# Patient Record
Sex: Female | Born: 1950 | Race: White | Hispanic: No | Marital: Married | Smoking: Former smoker
Health system: Southern US, Community
[De-identification: ages and names within clinical notes are randomized; demographics above are authoritative.]

## PROBLEM LIST (undated history)

## (undated) DIAGNOSIS — F419 Anxiety disorder, unspecified: Secondary | ICD-10-CM

## (undated) DIAGNOSIS — T7840XA Allergy, unspecified, initial encounter: Secondary | ICD-10-CM

## (undated) DIAGNOSIS — T8859XA Other complications of anesthesia, initial encounter: Secondary | ICD-10-CM

## (undated) DIAGNOSIS — Z9889 Other specified postprocedural states: Secondary | ICD-10-CM

## (undated) DIAGNOSIS — M1990 Unspecified osteoarthritis, unspecified site: Secondary | ICD-10-CM

## (undated) DIAGNOSIS — R112 Nausea with vomiting, unspecified: Secondary | ICD-10-CM

## (undated) DIAGNOSIS — F329 Major depressive disorder, single episode, unspecified: Secondary | ICD-10-CM

## (undated) DIAGNOSIS — K219 Gastro-esophageal reflux disease without esophagitis: Secondary | ICD-10-CM

## (undated) DIAGNOSIS — J45909 Unspecified asthma, uncomplicated: Secondary | ICD-10-CM

## (undated) DIAGNOSIS — F32A Depression, unspecified: Secondary | ICD-10-CM

## (undated) DIAGNOSIS — E039 Hypothyroidism, unspecified: Secondary | ICD-10-CM

## (undated) DIAGNOSIS — E785 Hyperlipidemia, unspecified: Secondary | ICD-10-CM

## (undated) HISTORY — DX: Unspecified asthma, uncomplicated: J45.909

## (undated) HISTORY — PX: BREAST CYST ASPIRATION: SHX578

## (undated) HISTORY — PX: TONSILLECTOMY/ADENOIDECTOMY/TURBINATE REDUCTION: SHX6126

## (undated) HISTORY — DX: Anxiety disorder, unspecified: F41.9

## (undated) HISTORY — DX: Hyperlipidemia, unspecified: E78.5

## (undated) HISTORY — DX: Allergy, unspecified, initial encounter: T78.40XA

## (undated) HISTORY — DX: Unspecified osteoarthritis, unspecified site: M19.90

## (undated) HISTORY — DX: Hypothyroidism, unspecified: E03.9

## (undated) HISTORY — PX: ABDOMINAL HYSTERECTOMY: SHX81

## (undated) HISTORY — PX: WISDOM TOOTH EXTRACTION: SHX21

## (undated) HISTORY — PX: OVARIAN CYST SURGERY: SHX726

## (undated) HISTORY — DX: Major depressive disorder, single episode, unspecified: F32.9

## (undated) HISTORY — DX: Gastro-esophageal reflux disease without esophagitis: K21.9

## (undated) HISTORY — DX: Depression, unspecified: F32.A

---

## 1989-11-03 HISTORY — PX: VAGINAL HYSTERECTOMY: SUR661

## 1998-07-03 ENCOUNTER — Other Ambulatory Visit: Admission: RE | Admit: 1998-07-03 | Discharge: 1998-07-03 | Payer: Self-pay | Admitting: Obstetrics and Gynecology

## 2001-01-06 ENCOUNTER — Ambulatory Visit (HOSPITAL_COMMUNITY): Admission: RE | Admit: 2001-01-06 | Discharge: 2001-01-06 | Payer: Self-pay | Admitting: Internal Medicine

## 2001-01-06 ENCOUNTER — Encounter: Payer: Self-pay | Admitting: Internal Medicine

## 2001-11-22 ENCOUNTER — Other Ambulatory Visit: Admission: RE | Admit: 2001-11-22 | Discharge: 2001-11-22 | Payer: Self-pay | Admitting: Obstetrics and Gynecology

## 2001-11-25 ENCOUNTER — Encounter: Admission: RE | Admit: 2001-11-25 | Discharge: 2001-11-25 | Payer: Self-pay | Admitting: Obstetrics and Gynecology

## 2001-11-25 ENCOUNTER — Encounter: Payer: Self-pay | Admitting: Obstetrics and Gynecology

## 2001-12-02 ENCOUNTER — Encounter: Payer: Self-pay | Admitting: *Deleted

## 2001-12-02 ENCOUNTER — Encounter: Admission: RE | Admit: 2001-12-02 | Discharge: 2001-12-02 | Payer: Self-pay | Admitting: *Deleted

## 2002-01-19 ENCOUNTER — Encounter: Admission: RE | Admit: 2002-01-19 | Discharge: 2002-01-19 | Payer: Self-pay | Admitting: *Deleted

## 2002-01-19 ENCOUNTER — Encounter: Payer: Self-pay | Admitting: *Deleted

## 2002-12-19 ENCOUNTER — Other Ambulatory Visit: Admission: RE | Admit: 2002-12-19 | Discharge: 2002-12-19 | Payer: Self-pay | Admitting: Obstetrics and Gynecology

## 2002-12-20 ENCOUNTER — Encounter: Payer: Self-pay | Admitting: Obstetrics and Gynecology

## 2002-12-20 ENCOUNTER — Encounter: Admission: RE | Admit: 2002-12-20 | Discharge: 2002-12-20 | Payer: Self-pay | Admitting: Obstetrics and Gynecology

## 2003-12-21 ENCOUNTER — Other Ambulatory Visit: Admission: RE | Admit: 2003-12-21 | Discharge: 2003-12-21 | Payer: Self-pay | Admitting: Obstetrics & Gynecology

## 2003-12-26 ENCOUNTER — Encounter: Admission: RE | Admit: 2003-12-26 | Discharge: 2003-12-26 | Payer: Self-pay | Admitting: Obstetrics and Gynecology

## 2005-01-07 ENCOUNTER — Encounter: Admission: RE | Admit: 2005-01-07 | Discharge: 2005-01-07 | Payer: Self-pay | Admitting: Obstetrics and Gynecology

## 2006-03-12 ENCOUNTER — Encounter: Admission: RE | Admit: 2006-03-12 | Discharge: 2006-03-12 | Payer: Self-pay | Admitting: Internal Medicine

## 2007-04-13 ENCOUNTER — Encounter: Admission: RE | Admit: 2007-04-13 | Discharge: 2007-04-13 | Payer: Self-pay | Admitting: Obstetrics and Gynecology

## 2008-04-28 ENCOUNTER — Encounter: Admission: RE | Admit: 2008-04-28 | Discharge: 2008-04-28 | Payer: Self-pay | Admitting: Obstetrics and Gynecology

## 2009-06-08 ENCOUNTER — Encounter: Admission: RE | Admit: 2009-06-08 | Discharge: 2009-06-08 | Payer: Self-pay | Admitting: Obstetrics and Gynecology

## 2009-11-03 HISTORY — PX: COLONOSCOPY: SHX174

## 2009-12-17 ENCOUNTER — Ambulatory Visit: Payer: Self-pay | Admitting: Internal Medicine

## 2010-04-10 ENCOUNTER — Encounter (INDEPENDENT_AMBULATORY_CARE_PROVIDER_SITE_OTHER): Payer: Self-pay | Admitting: *Deleted

## 2010-05-03 ENCOUNTER — Encounter (INDEPENDENT_AMBULATORY_CARE_PROVIDER_SITE_OTHER): Payer: Self-pay | Admitting: *Deleted

## 2010-05-08 ENCOUNTER — Ambulatory Visit: Payer: Self-pay | Admitting: Internal Medicine

## 2010-05-08 ENCOUNTER — Telehealth: Payer: Self-pay | Admitting: Internal Medicine

## 2010-05-22 ENCOUNTER — Ambulatory Visit: Payer: Self-pay | Admitting: Internal Medicine

## 2010-06-04 ENCOUNTER — Ambulatory Visit: Payer: Self-pay | Admitting: Internal Medicine

## 2010-11-21 ENCOUNTER — Encounter
Admission: RE | Admit: 2010-11-21 | Discharge: 2010-11-21 | Payer: Self-pay | Source: Home / Self Care | Attending: Obstetrics and Gynecology | Admitting: Obstetrics and Gynecology

## 2010-12-05 NOTE — Letter (Signed)
Summary: Previsit letter  [facility]  [address] [place], [place] [address]   Phone: [phone]  Fax: [phone]       04/10/2010 MRN: [id number]  Mary Murillo [address] [place], [place]  [address]  Dear Ms. [name],  Welcome to the Gastroenterology Division at [facility].    You are scheduled to see a nurse for your pre-procedure visit on 05-08-10 at 8:30am on the 3rd floor at [facility], [address]  We ask that you try to arrive at our office 15 minutes prior to your appointment time to allow for check-in.  Your nurse visit will consist of discussing your medical and surgical history, your immediate family medical history, and your medications.    Please bring a complete list of all your medications or, if you prefer, bring the medication bottles and we will list them.  We will need to be aware of both prescribed and over the counter drugs.  We will need to know exact dosage information as well.  If you are on blood thinners (Coumadin, Plavix, Aggrenox, Ticlid, etc.) please call our office today/prior to your appointment, as we need to consult with your physician about holding your medication.   Please be prepared to read and sign documents such as consent forms, a financial agreement, and acknowledgement forms.  If necessary, and with your consent, a friend or relative is welcome to sit-in on the nurse visit with you.  Please bring your insurance card so that we may make a copy of it.  If your insurance requires a referral to see a specialist, please bring your referral form from your primary care physician.  No co-pay is required for this nurse visit.     If you cannot keep your appointment, please call [phone] to cancel or reschedule prior to your appointment date.  This allows [place] the opportunity to schedule an appointment for another patient in need of care.    Thank you for choosing [facility] for your medical needs.  We appreciate the  opportunity to care for you.  Please visit [place] at our website  to learn more about our practice.                     Sincerely.                                                                                                                   The Gastroenterology Division

## 2010-12-05 NOTE — Letter (Signed)
Summary: [facility] Instructions  [facility]  [address] [place], [place] [address]   Phone: [phone]  Fax: [phone]       LACONDA BASICH    12-27-1950    MRN: [id number]       Procedure Day Dorna Bloom:  Wednesday 05/22/2010     Arrival Time:  7:30 am     Procedure Time:  8:30 am     Location of Procedure:                    _x _  [facility] (4th Floor)    PREPARATION FOR COLONOSCOPY WITH MIRALAX  Starting 5 days prior to your procedure Friday 7/15 do not eat nuts, seeds, popcorn, corn, beans, peas,  salads, or any raw vegetables.  Do not take any fiber supplements (e.g. Metamucil, Citrucel, and Benefiber). ____________________________________________________________________________________________________   THE DAY BEFORE YOUR PROCEDURE         DATE: Tuesday 7//19  1   Drink clear liquids the entire day-NO SOLID FOOD  2   Do not drink anything colored red or purple.  Avoid juices with pulp.  No orange juice.  3   Drink at least 64 oz. (8 glasses) of fluid/clear liquids during the day to prevent dehydration and help the prep work efficiently.  CLEAR LIQUIDS INCLUDE: Water Jello Ice Popsicles Tea (sugar ok, no milk/cream) Powdered fruit flavored drinks Coffee (sugar ok, no milk/cream) Gatorade Juice: apple, white grape, white cranberry  Lemonade Clear bullion, consomm, broth Carbonated beverages (any kind) Strained chicken noodle soup Hard Candy  4   Mix the entire bottle of Miralax with 64 oz. of Gatorade/Powerade in the morning and put in the refrigerator to chill.  5   At 3:00 pm take 2 Dulcolax/Bisacodyl tablets.  6   At 4:30 pm take one Reglan/Metoclopramide tablet.  7  Starting at 5:00 pm drink one 8 oz glass of the Miralax mixture every 15-20 minutes until you have finished drinking the entire 64 oz.  You should finish drinking prep around 7:30 or 8:00 pm.  8   If you are nauseated, you may take the 2nd Reglan/Metoclopramide  tablet at 6:30 pm.        9    At 8:00 pm take 2 more DULCOLAX/Bisacodyl tablets.     THE DAY OF YOUR PROCEDURE      DATE:  Wednesday 7/20  You may drink clear liquids until 6:30 am   (2 HOURS BEFORE PROCEDURE).   MEDICATION INSTRUCTIONS  Unless otherwise instructed, you should take regular prescription medications with a small sip of water as early as possible the morning of your procedure.           OTHER INSTRUCTIONS  You will need a responsible adult at least 61 years of age to accompany you and drive you home.   This person must remain in the waiting room during your procedure.  Wear loose fitting clothing that is easily removed.  Leave jewelry and other valuables at home.  However, you may wish to bring a book to read or an iPod/MP3 player to listen to music as you wait for your procedure to start.  Remove all body piercing jewelry and leave at home.  Total time from sign-in until discharge is approximately 2-3 hours.  You should go home directly after your procedure and rest.  You can resume normal activities the day after your procedure.  The day of your procedure you should not:  Drive   Make legal decisions   Operate machinery   Drink alcohol   Return to work  You will receive specific instructions about eating, activities and medications before you leave.   The above instructions have been reviewed and explained to me by   Ezra Sites RN  May 08, 2010 8:48 AM     I fully understand and can verbalize these instructions _____________________________ Date _______

## 2010-12-05 NOTE — Procedures (Signed)
Summary: Colonoscopy  Patient: Mary Murillo Note: All result statuses are Final unless otherwise noted.  Tests: (1) Colonoscopy (COL)   COL Colonoscopy           DONE      [facility]     [address]     [place], [place]  [address]           COLONOSCOPY PROCEDURE REPORT           PATIENT:  Mary Murillo, Mary Murillo  MR#:  [id number]     BIRTHDATE:  June 08, 1951, 59 yrs. old  GENDER:  female     ENDOSCOPIST:  Hedwig Morton. Juanda Chance, MD     REF. BY:  Sharlet Salina, M.D.     PROCEDURE DATE:  05/22/2010     PROCEDURE:  Colonoscopy 19147     ASA CLASS:  Class I     INDICATIONS:  Routine Risk Screening hx hemorrhoids, recent     hematochezia     last colon 12/2000     MEDICATIONS:   Versed 7 mg, Fentanyl 75 mcg           DESCRIPTION OF PROCEDURE:   After the risks benefits and     alternatives of the procedure were thoroughly explained, informed     consent was obtained.  Digital rectal exam was performed and     revealed no rectal masses.   The LB [id number] endoscope     was introduced through the anus and advanced to the cecum, which     was identified by both the appendix and ileocecal valve, without     limitations.  The quality of the prep was good, using MiraLax.     The instrument was then slowly withdrawn as the colon was fully     examined.     <<PROCEDUREIMAGES>>           FINDINGS:  Internal hemorrhoids were found (see image5 and     image6).  Mild diverticulosis was found (see image1). few sigmoid     diverticuli   Retroflexed views in the rectum revealed no     abnormalities.    The scope was then withdrawn from the patient     and the procedure completed.           COMPLICATIONS:  None     ENDOSCOPIC IMPRESSION:     1) Internal hemorrhoids     2) Mild diverticulosis     RECOMMENDATIONS:     1) high fiber diet     Anusol HC supp 1 hs, #12, 3 refills     REPEAT EXAM:  In 10 year(s) for.           ______________________________     Hedwig Morton. Juanda Chance, MD            CC:           n.     eSIGNED:   Hedwig Morton. Evelina Lore at 05/22/2010 09:46 AM           Roselee Culver, [phone]  Note: An exclamation mark (!) indicates a result that was not dispersed into the flowsheet. Document Creation Date: 05/22/2010 9:48 AM _______________________________________________________________________  (1) Order result status: Final Collection or observation date-time: 05/22/2010 09:40 Requested date-time:  Receipt date-time:  Reported date-time:  Referring Physician:   Ordering Physician: Lina Sar [phone]) Specimen Source:  Source: Launa Grill Order Number: [id number] Lab site:   Appended Document: Colonoscopy    Clinical Lists Changes  Observations: Added new observation of COLONNXTDUE: 05/2020 (05/22/2010 11:31)

## 2010-12-05 NOTE — Procedures (Signed)
Summary: COLONOSCOPY   Colonoscopy  Procedure date:  01/06/2001  Findings:      Location:  [facility].    Patient Name: Mary Murillo, Mary Murillo MRN: [id number] Procedure Procedures: Colonoscopy CPT: 40981.  Personnel: Endoscopist: Dora L. Juanda Chance, MD.  Exam Location: Exam performed in Endoscopy Suite.  Patient Consent: Procedure, Alternatives, Risks and Benefits discussed, consent obtained,  Indications  Evaluation of: Positive fecal occult blood test per digital rectal exam.  Symptoms: Constipation Patient's stools are infrequent. Hematochezia.  History  Pre-Exam Physical: Performed Jan 06, 2001. Cardio-pulmonary exam, Rectal exam, HEENT exam , Abdominal exam, Extremity exam, Neurological exam, Mental status exam WNL.  Exam Exam: Extent of exam reached: Cecum, extent intended: Cecum.  Colon retroflexion performed. Images taken. ASA Classification: I. Tolerance: excellent.  Monitoring: Pulse and BP monitoring, Oximetry used. Supplemental O2 given.  Colon Prep Used Golytely for colon prep. Prep results: excellent.  Fluoroscopy: Fluoroscopy was not used.  Sedation Meds: Demerol 80 mg. Versed 7 mg.  Findings - OTHER FINDING: thickened folds found in Sigmoid Colon.  HEMORRHOIDS: Size: Grade I. Not bleeding. ICD9: Hemorrhoids, Internal: 455.0.   Assessment Abnormal examination, see findings above.  Diagnoses: 455.0: Hemorrhoids, Internal.   Events  Unplanned Interventions: No intervention was required.  Unplanned Events: There were no complications. Plans Medication Plan: Hemorrhoidal Medications: Anusol Suppositories 1 HS, starting Jan 06, 2001   Patient Education: Patient given standard instructions for: Hemorrhoids. Yearly hemoccult testing recommended. Patient instructed to get routine colonoscopy every 10 years.  Comments: high fiber diet Disposition: After procedure patient sent to recovery.  Scheduling/Referral: Follow-Up prn.    CC: Nathanial Millman.Henderson,MD  This report was created from the original endoscopy report, which was reviewed and signed by the above listed endoscopist.

## 2010-12-05 NOTE — Miscellaneous (Signed)
Summary: anusol supp script  Clinical Lists Changes  Medications: Added new medication of ANUSOL-HC 25 MG  SUPP (HYDROCORTISONE ACETATE) one at bedtime - Signed Rx of ANUSOL-HC 25 MG  SUPP (HYDROCORTISONE ACETATE) one at bedtime;  #12 x 3;  Signed;  Entered by: Joylene John RN;  Authorized by: Hart Carwin MD;  Method used: [facility]  [phone][facility] [address] [place], [place], [place]  [address], Ph: [phone] or [phone], Fax: [phone]    Prescriptions: ANUSOL-HC 25 MG  SUPP (HYDROCORTISONE ACETATE) one at bedtime  #12 x 3   Entered by:   Joylene John RN   Authorized by:   Hart Carwin MD   Signed by:   Joylene John RN on 05/22/2010   Method used:   [facility] to        [facility]  [address] [phone]* (retail)       [address] [place], [place]  [address]       Ph: [phone] or [phone]       Fax: [phone]   RxID:   [id number]

## 2010-12-05 NOTE — Miscellaneous (Signed)
Summary: [facility]  Clinical Lists Changes  Medications: Added new medication of MIRALAX   POWD (POLYETHYLENE GLYCOL 3350) As per prep  instructions. - Signed Added new medication of DULCOLAX 5 MG  TBEC (BISACODYL) Day before procedure take 2 at 3pm and 2 at 8pm. - Signed Added new medication of REGLAN 10 MG  TABS (METOCLOPRAMIDE HCL) As per prep instructions. - Signed Rx of MIRALAX   POWD (POLYETHYLENE GLYCOL 3350) As per prep  instructions.;  #255gm x 0;  Signed;  Entered by: Ezra Sites RN;  Authorized by: Hart Carwin MD;  Method used: [facility]  [phone]*, [address] [place], [place], [place]  [address], Ph: [phone] or [phone], Fax: [phone] Rx of DULCOLAX 5 MG  TBEC (BISACODYL) Day before procedure take 2 at 3pm and 2 at 8pm.;  #4 x 0;  Signed;  Entered by: Ezra Sites RN;  Authorized by: Hart Carwin MD;  Method used: [facility]  [phone]*, [address] [place], [place], [place]  [address], Ph: [phone] or [phone], Fax: [phone] Rx of REGLAN 10 MG  TABS (METOCLOPRAMIDE HCL) As per prep instructions.;  #2 x 0;  Signed;  Entered by: Ezra Sites RN;  Authorized by: Hart Carwin MD;  Method used: [facility]  [phone]*, [address] [place], [place], [place]  [address], Ph: [phone] or [phone], Fax: [phone] Allergies: Added new allergy or adverse reaction of * TERAZOL CREAM Added new allergy or adverse reaction of * SURGICAL STEEL (STAPLES) Observations: Added new observation of NKA: F (05/08/2010 8:18)    Prescriptions: REGLAN 10 MG  TABS (METOCLOPRAMIDE HCL) As per prep instructions.  #2 x 0   Entered by:   Ezra Sites RN   Authorized by:   Hart Carwin MD   Signed by:   Ezra Sites RN on 05/08/2010   Method used:   [facility]ron[facility] to        [facility]  [address] [phone]* (retail)       [address]       [place], [place]  [address]       Ph: [phone] or [phone]       Fax: [phone]   RxID:    [id number] DULCOLAX 5 MG  TBEC (BISACODYL) Day before procedure take 2 at 3pm and 2 at 8pm.  #4 x 0   Entered by:   Ezra Sites RN   Authorized by:   Hart Carwin MD   Signed by:   Ezra Sites RN on 05/08/2010   Method used:   [facility]        [facility]  [address] [phone]* (retail)       [address] [place], [place]  [address]       Ph: [phone] or [phone]       Fax: [phone]   RxID:   [id number] MIRALAX   POWD (POLYETHYLENE GLYCOL 3350) As per prep  instructions.  #255gm x 0   Entered by:   Ezra Sites RN   Authorized by:   Hart Carwin MD   Signed by:   Ezra Sites RN on 05/08/2010   Method used:   [facility]        [facility]  [address] [phone]* (retail)       [address] [place]  [address]       Ph: [phone] or [phone]       Fax: [phone]   RxID:   [id number]

## 2010-12-05 NOTE — Progress Notes (Signed)
Summary: Triage  Phone Note Call from Patient Call back at Home Phone [phone]   Caller: Patient Call For: Dr. Juanda Chance Reason for Call: Talk to Nurse Summary of Call: hemorroids and wants to know if something can be prescribed....[facility] Initial call taken by: Karna Christmas,  May 08, 2010 8:58 AM  Follow-up for Phone Call        Pt. never seen in the office, is scheduled for a  Colon on 05-22-10, last colon 01-06-2001. C/O rectal bleeding in the past, none currently. States she always has hemorrhoids, uses prep-H OTC as needed. Wants to know if Dr.Lanna Labella will give her prescription strength hemorrhoid meds. "Just in case I need it to decrease the swelling before my colonoscopy."   DR.Ferlin Fairhurst PLEASE ADVISE  Follow-up by: Laureen Ochs LPN,  May 08, 8656 10:43 AM  Additional Follow-up for Phone Call Additional follow up Details #1::        Anusol HC supp, #12, Insert 1 at bedtime, she is a close acquaintance. Additional Follow-up by: Hart Carwin MD,  May 08, 2010 5:29 PM    Additional Follow-up for Phone Call Additional follow up Details #2::    Above MD orders reviewed with patient. Pt. instructed to call back as needed.  Follow-up by: Laureen Ochs LPN,  May 09, 8468 8:07 AM  New/Updated Medications: ANUCORT-HC 25 MG SUPP (HYDROCORTISONE ACETATE) Put 1 in rectum every night at bedtime. Prescriptions: ANUCORT-HC 25 MG SUPP (HYDROCORTISONE ACETATE) Put 1 in rectum every night at bedtime.  #12 x 1   Entered by:   Laureen Ochs LPN   Authorized by:   Hart Carwin MD   Signed by:   Laureen Ochs LPN on 62/95/2841   Method used:   [facility]        [facility]  [address] [phone][facility] ([facility]ail)       [address]       [place], [place]  [address]       Ph: [phone] or [phone]       Fax: [phone]   RxID:   [id number]

## 2011-03-03 ENCOUNTER — Other Ambulatory Visit: Payer: Self-pay | Admitting: Internal Medicine

## 2011-03-04 ENCOUNTER — Encounter (INDEPENDENT_AMBULATORY_CARE_PROVIDER_SITE_OTHER): Payer: BC Managed Care – PPO | Admitting: Internal Medicine

## 2011-03-04 DIAGNOSIS — E039 Hypothyroidism, unspecified: Secondary | ICD-10-CM

## 2011-03-04 DIAGNOSIS — Z23 Encounter for immunization: Secondary | ICD-10-CM

## 2011-03-04 DIAGNOSIS — E785 Hyperlipidemia, unspecified: Secondary | ICD-10-CM

## 2011-03-04 DIAGNOSIS — Z0000 Encounter for general adult medical examination without abnormal findings: Secondary | ICD-10-CM

## 2011-04-28 ENCOUNTER — Other Ambulatory Visit: Payer: BC Managed Care – PPO | Admitting: Internal Medicine

## 2011-04-28 DIAGNOSIS — E789 Disorder of lipoprotein metabolism, unspecified: Secondary | ICD-10-CM

## 2011-04-28 DIAGNOSIS — E039 Hypothyroidism, unspecified: Secondary | ICD-10-CM

## 2011-04-28 LAB — LIPID PANEL
LDL Cholesterol: 156 mg/dL — ABNORMAL HIGH (ref 0–99)
Triglycerides: 117 mg/dL (ref <150)
VLDL: 23 mg/dL (ref 0–40)

## 2011-04-29 ENCOUNTER — Ambulatory Visit
Admission: RE | Admit: 2011-04-29 | Discharge: 2011-04-29 | Disposition: A | Discharge status: Home / Self Care | Payer: BC Managed Care – PPO | Source: Ambulatory Visit | Attending: Internal Medicine | Admitting: Internal Medicine

## 2011-04-29 ENCOUNTER — Ambulatory Visit (INDEPENDENT_AMBULATORY_CARE_PROVIDER_SITE_OTHER): Payer: BC Managed Care – PPO | Admitting: Internal Medicine

## 2011-04-29 ENCOUNTER — Other Ambulatory Visit: Payer: Self-pay | Admitting: Internal Medicine

## 2011-04-29 DIAGNOSIS — E039 Hypothyroidism, unspecified: Secondary | ICD-10-CM

## 2011-04-29 DIAGNOSIS — E559 Vitamin D deficiency, unspecified: Secondary | ICD-10-CM

## 2011-04-29 DIAGNOSIS — R52 Pain, unspecified: Secondary | ICD-10-CM

## 2011-04-29 DIAGNOSIS — F3289 Other specified depressive episodes: Secondary | ICD-10-CM

## 2011-04-29 DIAGNOSIS — F329 Major depressive disorder, single episode, unspecified: Secondary | ICD-10-CM

## 2011-04-29 DIAGNOSIS — J309 Allergic rhinitis, unspecified: Secondary | ICD-10-CM

## 2011-04-29 DIAGNOSIS — F32A Depression, unspecified: Secondary | ICD-10-CM

## 2011-04-29 DIAGNOSIS — E785 Hyperlipidemia, unspecified: Secondary | ICD-10-CM

## 2011-04-30 ENCOUNTER — Encounter: Payer: Self-pay | Admitting: Internal Medicine

## 2011-04-30 DIAGNOSIS — F32A Depression, unspecified: Secondary | ICD-10-CM | POA: Insufficient documentation

## 2011-04-30 DIAGNOSIS — J309 Allergic rhinitis, unspecified: Secondary | ICD-10-CM | POA: Insufficient documentation

## 2011-04-30 DIAGNOSIS — F329 Major depressive disorder, single episode, unspecified: Secondary | ICD-10-CM | POA: Insufficient documentation

## 2011-04-30 DIAGNOSIS — E039 Hypothyroidism, unspecified: Secondary | ICD-10-CM | POA: Insufficient documentation

## 2011-04-30 DIAGNOSIS — E559 Vitamin D deficiency, unspecified: Secondary | ICD-10-CM | POA: Insufficient documentation

## 2011-04-30 DIAGNOSIS — E785 Hyperlipidemia, unspecified: Secondary | ICD-10-CM | POA: Insufficient documentation
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
## 2011-04-30 NOTE — Patient Instructions (Signed)
Continue thyroid medication as prescribed. Begin Crestor 5 mg daily. We will reassess lipid panel in 3 months

## 2011-05-01 ENCOUNTER — Encounter: Payer: Self-pay | Admitting: Internal Medicine

## 2011-05-19 ENCOUNTER — Telehealth: Payer: Self-pay | Admitting: Internal Medicine

## 2011-05-19 NOTE — Telephone Encounter (Signed)
The medication has been approved for coverage.  Pt notified.

## 2011-07-30 ENCOUNTER — Encounter: Payer: Self-pay | Admitting: Internal Medicine

## 2011-07-31 ENCOUNTER — Other Ambulatory Visit: Payer: BC Managed Care – PPO | Admitting: Internal Medicine

## 2011-08-08 ENCOUNTER — Other Ambulatory Visit: Payer: BC Managed Care – PPO | Admitting: Internal Medicine

## 2011-08-08 DIAGNOSIS — Z79899 Other long term (current) drug therapy: Secondary | ICD-10-CM

## 2011-08-08 DIAGNOSIS — E785 Hyperlipidemia, unspecified: Secondary | ICD-10-CM

## 2011-08-08 LAB — HEPATIC FUNCTION PANEL
Alkaline Phosphatase: 88 U/L (ref 39–117)
Bilirubin, Direct: 0.1 mg/dL (ref 0.0–0.3)
Indirect Bilirubin: 0.7 mg/dL (ref 0.0–0.9)
Total Protein: 7.1 g/dL (ref 6.0–8.3)

## 2011-08-08 LAB — LIPID PANEL
LDL Cholesterol: 108 mg/dL — ABNORMAL HIGH (ref 0–99)
Triglycerides: 105 mg/dL (ref <150)

## 2011-09-08 ENCOUNTER — Encounter: Payer: Self-pay | Admitting: Internal Medicine

## 2011-12-08 ENCOUNTER — Other Ambulatory Visit: Payer: Self-pay | Admitting: Internal Medicine

## 2011-12-08 DIAGNOSIS — Z1231 Encounter for screening mammogram for malignant neoplasm of breast: Secondary | ICD-10-CM

## 2011-12-30 ENCOUNTER — Ambulatory Visit
Admission: RE | Admit: 2011-12-30 | Discharge: 2011-12-30 | Disposition: A | Discharge status: Home / Self Care | Payer: BC Managed Care – PPO | Source: Ambulatory Visit | Attending: Internal Medicine | Admitting: Internal Medicine

## 2011-12-30 DIAGNOSIS — Z1231 Encounter for screening mammogram for malignant neoplasm of breast: Secondary | ICD-10-CM

## 2012-02-16 ENCOUNTER — Other Ambulatory Visit: Payer: Self-pay | Admitting: Internal Medicine

## 2012-02-20 ENCOUNTER — Other Ambulatory Visit: Payer: Self-pay | Admitting: Internal Medicine

## 2012-03-10 ENCOUNTER — Other Ambulatory Visit: Payer: Self-pay | Admitting: Internal Medicine

## 2012-03-15 ENCOUNTER — Other Ambulatory Visit: Payer: BC Managed Care – PPO | Admitting: Internal Medicine

## 2012-03-16 ENCOUNTER — Encounter: Payer: BC Managed Care – PPO | Admitting: Internal Medicine

## 2012-03-22 ENCOUNTER — Other Ambulatory Visit: Payer: Self-pay | Admitting: Internal Medicine

## 2012-04-12 ENCOUNTER — Other Ambulatory Visit: Payer: Self-pay | Admitting: Internal Medicine

## 2012-05-03 ENCOUNTER — Other Ambulatory Visit: Payer: Self-pay | Admitting: Internal Medicine

## 2012-05-03 ENCOUNTER — Other Ambulatory Visit: Payer: BC Managed Care – PPO | Admitting: Internal Medicine

## 2012-05-03 DIAGNOSIS — E039 Hypothyroidism, unspecified: Secondary | ICD-10-CM

## 2012-05-03 DIAGNOSIS — E559 Vitamin D deficiency, unspecified: Secondary | ICD-10-CM

## 2012-05-03 DIAGNOSIS — I10 Essential (primary) hypertension: Secondary | ICD-10-CM

## 2012-05-03 LAB — LIPID PANEL
Cholesterol: 141 mg/dL (ref 0–200)
HDL: 45 mg/dL (ref >39)
LDL Cholesterol: 80 mg/dL (ref 0–99)
Triglycerides: 82 mg/dL (ref <150)
VLDL: 16 mg/dL (ref 0–40)

## 2012-05-03 LAB — CBC WITH DIFFERENTIAL/PLATELET
Basophils Relative: 0 % (ref 0–1)
Eosinophils Absolute: 0.1 10*3/uL (ref 0.0–0.7)
Eosinophils Relative: 2 % (ref 0–5)
HCT: 38.9 % (ref 36.0–46.0)
Hemoglobin: 13.2 g/dL (ref 12.0–15.0)
MCH: 29.6 pg (ref 26.0–34.0)
MCHC: 33.9 g/dL (ref 30.0–36.0)
MCV: 87.2 fL (ref 78.0–100.0)
Monocytes Absolute: 0.7 10*3/uL (ref 0.1–1.0)
Monocytes Relative: 11 % (ref 3–12)
Neutrophils Relative %: 70 % (ref 43–77)

## 2012-05-03 LAB — COMPREHENSIVE METABOLIC PANEL
Alkaline Phosphatase: 87 U/L (ref 39–117)
BUN: 11 mg/dL (ref 6–23)
CO2: 25 mEq/L (ref 19–32)
Glucose, Bld: 86 mg/dL (ref 70–99)
Total Bilirubin: 0.8 mg/dL (ref 0.3–1.2)

## 2012-05-04 ENCOUNTER — Encounter: Payer: Self-pay | Admitting: Internal Medicine

## 2012-05-04 ENCOUNTER — Ambulatory Visit (INDEPENDENT_AMBULATORY_CARE_PROVIDER_SITE_OTHER): Payer: BC Managed Care – PPO | Admitting: Internal Medicine

## 2012-05-04 VITALS — BP 116/80 | HR 84 | Temp 100.0°F | Ht 62.5 in | Wt 188.0 lb

## 2012-05-04 DIAGNOSIS — W57XXXA Bitten or stung by nonvenomous insect and other nonvenomous arthropods, initial encounter: Secondary | ICD-10-CM

## 2012-05-04 DIAGNOSIS — F329 Major depressive disorder, single episode, unspecified: Secondary | ICD-10-CM

## 2012-05-04 DIAGNOSIS — E785 Hyperlipidemia, unspecified: Secondary | ICD-10-CM

## 2012-05-04 DIAGNOSIS — F3289 Other specified depressive episodes: Secondary | ICD-10-CM

## 2012-05-04 DIAGNOSIS — Z8639 Personal history of other endocrine, nutritional and metabolic disease: Secondary | ICD-10-CM

## 2012-05-04 DIAGNOSIS — E039 Hypothyroidism, unspecified: Secondary | ICD-10-CM

## 2012-05-04 DIAGNOSIS — Z8709 Personal history of other diseases of the respiratory system: Secondary | ICD-10-CM

## 2012-05-04 DIAGNOSIS — Z0000 Encounter for general adult medical examination without abnormal findings: Secondary | ICD-10-CM

## 2012-05-04 DIAGNOSIS — R509 Fever, unspecified: Secondary | ICD-10-CM

## 2012-05-04 DIAGNOSIS — J309 Allergic rhinitis, unspecified: Secondary | ICD-10-CM

## 2012-05-04 DIAGNOSIS — G4700 Insomnia, unspecified: Secondary | ICD-10-CM

## 2012-05-04 DIAGNOSIS — F32A Depression, unspecified: Secondary | ICD-10-CM

## 2012-05-04 DIAGNOSIS — J45909 Unspecified asthma, uncomplicated: Secondary | ICD-10-CM

## 2012-05-04 DIAGNOSIS — T148 Other injury of unspecified body region: Secondary | ICD-10-CM

## 2012-05-04 LAB — POCT URINALYSIS DIPSTICK
Bilirubin, UA: NEGATIVE
Ketones, UA: NEGATIVE
Leukocytes, UA: NEGATIVE
Protein, UA: NEGATIVE
Spec Grav, UA: 1.02
pH, UA: 6

## 2012-05-06 LAB — ROCKY MTN SPOTTED FVR ABS PNL(IGG+IGM): RMSF IgG: 0.07 IV

## 2012-05-29 DIAGNOSIS — Z8709 Personal history of other diseases of the respiratory system: Secondary | ICD-10-CM | POA: Insufficient documentation

## 2012-05-29 DIAGNOSIS — G4700 Insomnia, unspecified: Secondary | ICD-10-CM | POA: Insufficient documentation

## 2012-05-29 NOTE — Patient Instructions (Addendum)
Call if fever persist or symptoms do not resolve within the next few days. Otherwise return one year or as needed. Call Dr. Juanda Chance regarding repeat colonoscopy.
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
## 2012-08-11 ENCOUNTER — Emergency Department
Admission: EM | Admit: 2012-08-11 | Discharge: 2012-08-11 | Disposition: A | Discharge status: Home / Self Care | Payer: BC Managed Care – PPO | Source: Home / Self Care

## 2012-08-11 ENCOUNTER — Encounter: Payer: Self-pay | Admitting: *Deleted

## 2012-08-11 DIAGNOSIS — R300 Dysuria: Secondary | ICD-10-CM

## 2012-08-11 LAB — POCT URINALYSIS DIP (MANUAL ENTRY)
Glucose, UA: NEGATIVE
Protein Ur, POC: NEGATIVE
Spec Grav, UA: 1.01 (ref 1.005–1.03)
Urobilinogen, UA: 0.2 (ref 0–1)

## 2012-08-11 MED ORDER — CEPHALEXIN 500 MG PO CAPS: 500.0000 mg | ORAL_CAPSULE | Freq: Three times a day (TID) | ORAL | Status: AC

## 2012-08-11 MED ORDER — FLUCONAZOLE 150 MG PO TABS: 150.0000 mg | ORAL_TABLET | Freq: Once | ORAL | Status: DC

## 2012-08-11 NOTE — ED Notes (Signed)
Pt c/o urinary frequency, blood in urine and fever x last night. Denies abdominal pain and back pain.

## 2012-08-11 NOTE — ED Provider Notes (Addendum)
History     CSN: [id number]  Arrival date & time 08/11/12  1040   First MD Initiated Contact with Patient 08/11/12 1055      Chief Complaint  Patient presents with  . Urinary Frequency  . Hematuria  . Fever   HPI DYSURIA Onset:  1 day  Description: increased urinary frequency, dysuria, hematuria  Modifying factors: none   Symptoms Urgency:  yes Frequency: yes  Hesitancy:  yes Hematuria:  yes Flank Pain:  no Fever: yes Nausea/Vomiting:  no Missed LMP: no  STD exposure: no Discharge: n Irritants: no Rash: no  Red Flags   More than 3 UTI's last 12 months:  no PMH of  Diabetes or Immunosuppression:  no Renal Disease/Calculi: no Urinary Tract Abnormality:  no Instrumentation or Trauma: no    Past Medical History  Diagnosis Date  . Allergy   . Hyperlipidemia   . Vitamin D deficiency   . Hypothyroidism   . Depression     Past Surgical History  Procedure Date  . Abdominal hysterectomy   . Vaginal hysterectomy 1991    Family History  Problem Relation Age of Onset  . Cancer Sister     breast/ died    History  Substance Use Topics  . Smoking status: Former Games developer  . Smokeless tobacco: Never Used  . Alcohol Use: 1.0 oz/week    2 drink(s) per week    OB History    Grav Para Term Preterm Abortions TAB SAB Ect Mult Living                  Review of Systems  All other systems reviewed and are negative.    Allergies  Other and Terazol  Home Medications   Current Outpatient Rx  Name Route Sig Dispense Refill  . CETIRIZINE HCL 10 MG PO TABS Oral Take 10 mg by mouth daily.      Marland Kitchen VITAMIN D 2000 UNITS PO TABS Oral Take 2,000 Units by mouth daily.      . CRESTOR 5 MG PO TABS  TAKE 1 TABLET BY MOUTH DAILY 30 tablet 1  . FUROSEMIDE 20 MG PO TABS  TAKE 1 TABLET BY MOUTH AS NEEDED FOR EDEMA 30 tablet 1  . LEVOTHYROXINE SODIUM 50 MCG PO TABS  TAKE 1 TABLET BY MOUTH EVERY DAY 90 tablet 1    Dispense as written.  Marland Kitchen LEXAPRO 20 MG PO TABS  TAKE 1  TABLET BY MOUTH EVERY DAY 90 tablet 4    Dispense as written.    PT JUST GOT RX BUT IS OUT OF TOWN AND NEEDS REFILL .Marland Kitchen.  . MONTELUKAST SODIUM 10 MG PO TABS  TAKE 1 TABLET BY MOUTH EVERY DAY 90 tablet 3  . ZOLPIDEM TARTRATE 10 MG PO TABS Oral Take 10 mg by mouth at bedtime as needed.        BP 161/86  Pulse 110  Temp 98.7 F (37.1 C) (Oral)  Resp 18  Ht 5' 3.5" (1.613 m)  Wt 188 lb (85.276 kg)  BMI 32.78 kg/m2  SpO2 97%  Physical Exam  Constitutional: She appears well-developed and well-nourished.  HENT:  Head: Normocephalic and atraumatic.  Eyes: Conjunctivae normal are normal. Pupils are equal, round, and reactive to light.  Neck: Normal range of motion. Neck supple.  Cardiovascular: Normal rate and regular rhythm.   Pulmonary/Chest: Effort normal and breath sounds normal.  Abdominal: Soft. Bowel sounds are normal.       + suprapubic tenderness  No  flank pain     Musculoskeletal: Normal range of motion.  Neurological: She is alert.  Skin: Skin is warm.    ED Course  Procedures (including critical care time)   Labs Reviewed  POCT URINALYSIS DIP (MANUAL ENTRY)   No results found.   1. Dysuria       MDM  Will clinically treat for UTI  Keflex x 7 days  Diflucan for antibiotic assd vaginal candidiasis.  Urine culture  Discussed infectious and systemic red flags.  Would consider imaging if hematuria persists despite treatment.      The patient and/or caregiver has been counseled thoroughly with regard to treatment plan and/or medications prescribed including dosage, schedule, interactions, rationale for use, and possible side effects and they verbalize understanding. Diagnoses and expected course of recovery discussed and will return if not improved as expected or if the condition worsens. Patient and/or caregiver verbalized understanding.             Doree Albee, MD 08/11/12 1133  Doree Albee, MD 08/11/12 1147

## 2012-08-13 LAB — URINE CULTURE: Colony Count: 30000

## 2012-08-14 ENCOUNTER — Telehealth: Payer: Self-pay | Admitting: Family Medicine

## 2012-08-27 ENCOUNTER — Other Ambulatory Visit: Payer: Self-pay | Admitting: Internal Medicine

## 2012-09-06 ENCOUNTER — Ambulatory Visit (INDEPENDENT_AMBULATORY_CARE_PROVIDER_SITE_OTHER): Payer: BC Managed Care – PPO | Admitting: Internal Medicine

## 2012-09-06 ENCOUNTER — Encounter: Payer: Self-pay | Admitting: Internal Medicine

## 2012-09-06 VITALS — BP 128/88 | Temp 97.8°F | Wt 191.5 lb

## 2012-09-06 DIAGNOSIS — N390 Urinary tract infection, site not specified: Secondary | ICD-10-CM

## 2012-09-06 DIAGNOSIS — N898 Other specified noninflammatory disorders of vagina: Secondary | ICD-10-CM

## 2012-09-06 DIAGNOSIS — B3731 Acute candidiasis of vulva and vagina: Secondary | ICD-10-CM

## 2012-09-06 DIAGNOSIS — B373 Candidiasis of vulva and vagina: Secondary | ICD-10-CM

## 2012-09-06 LAB — POCT URINALYSIS DIPSTICK
Bilirubin, UA: NEGATIVE
Blood, UA: NEGATIVE
Ketones, UA: NEGATIVE
Protein, UA: NEGATIVE
Spec Grav, UA: 1.015
pH, UA: 6

## 2012-09-06 NOTE — Progress Notes (Signed)
  Subjective:    Patient ID: Mary Murillo, female    DOB: [birth year], 61 y.o.   MRN: [id number]  HPI For recheck on UTI. Urine specimen checked. Had hematuria with most recent UTI. Now  has vaginal itching. She is going to [place] for several weeks for study and vacation. She is a Veterinary surgeon by vocation. Patient has history of recurrent candida vaginitis infections. Says she cannot use Terazol because of adverse reaction in the past. The only thing that works for her is Diflucan. She wants to be sure she has plenty of Diflucan to take a trip with her. I'm a bit reluctant to give it to her on a daily basis. I think she should take it only if she develops Candida vaginitis.    Review of Systems     Objective:   Physical Exam White vaginal discharge. Wet prep reveals yeast.       Assessment & Plan:  Diflucan 150 mg x3 days for Candida vaginitis. For trip, will prescribe 150 mg daily for 2- 3 weeks. May try Lotrisone cream and genital area twice daily.

## 2012-09-07 LAB — POCT WET PREP (WET MOUNT)

## 2012-09-23 ENCOUNTER — Telehealth: Payer: Self-pay

## 2012-09-23 MED ORDER — FLUCONAZOLE 150 MG PO TABS: 150.0000 mg | ORAL_TABLET | Freq: Once | ORAL | Status: DC

## 2012-09-23 NOTE — Telephone Encounter (Signed)
Willing to call in 12 tabs only for trip. If this is not satisfactory needs to see GYN

## 2012-09-23 NOTE — Telephone Encounter (Signed)
Patient informed. 

## 2012-09-23 NOTE — Telephone Encounter (Signed)
On her third round of Diflucan 150mg  , total of 9 pills. Still feels she has a yeast infection. Is going out of town for 2 months and wants a rx for 30 tablets of the Diflucan to take with her.

## 2012-10-03 NOTE — Patient Instructions (Addendum)
Take Diflucan as directed

## 2012-11-27 ENCOUNTER — Other Ambulatory Visit: Payer: Self-pay | Admitting: Internal Medicine

## 2013-02-14 ENCOUNTER — Telehealth: Payer: Self-pay | Admitting: Internal Medicine

## 2013-02-14 ENCOUNTER — Ambulatory Visit: Payer: Self-pay | Admitting: Internal Medicine

## 2013-02-14 NOTE — Telephone Encounter (Signed)
Spoke with patient and advised to be here today at 4:45 to see Dr. Lenord Fellers for UTI and Diflucan Rx.

## 2013-02-15 ENCOUNTER — Encounter: Payer: Self-pay | Admitting: Internal Medicine

## 2013-02-15 ENCOUNTER — Ambulatory Visit (INDEPENDENT_AMBULATORY_CARE_PROVIDER_SITE_OTHER): Payer: BC Managed Care – PPO | Admitting: Internal Medicine

## 2013-02-15 ENCOUNTER — Other Ambulatory Visit: Payer: Self-pay | Admitting: Internal Medicine

## 2013-02-15 VITALS — BP 130/90 | Temp 99.0°F | Wt 191.0 lb

## 2013-02-15 DIAGNOSIS — N390 Urinary tract infection, site not specified: Secondary | ICD-10-CM

## 2013-02-15 DIAGNOSIS — Z1231 Encounter for screening mammogram for malignant neoplasm of breast: Secondary | ICD-10-CM

## 2013-02-22 ENCOUNTER — Ambulatory Visit (INDEPENDENT_AMBULATORY_CARE_PROVIDER_SITE_OTHER): Payer: BC Managed Care – PPO

## 2013-02-22 ENCOUNTER — Ambulatory Visit: Payer: BC Managed Care – PPO

## 2013-02-22 DIAGNOSIS — Z1231 Encounter for screening mammogram for malignant neoplasm of breast: Secondary | ICD-10-CM

## 2013-03-03 ENCOUNTER — Other Ambulatory Visit: Payer: Self-pay

## 2013-03-03 ENCOUNTER — Telehealth: Payer: Self-pay

## 2013-03-03 ENCOUNTER — Other Ambulatory Visit: Payer: Self-pay | Admitting: Internal Medicine

## 2013-03-03 MED ORDER — LEVOTHYROXINE SODIUM 50 MCG PO TABS: 50.0000 ug | ORAL_TABLET | Freq: Every day | ORAL | Status: DC

## 2013-03-03 MED ORDER — FLUCONAZOLE 150 MG PO TABS: 150.0000 mg | ORAL_TABLET | Freq: Once | ORAL | Status: DC

## 2013-03-03 NOTE — Telephone Encounter (Signed)
Has a really bad yeast infection from antibiotics following a UTI. Is itching and burning. Is requesting Diflucan, brand only, in a 100 or 300mg  tablet. Says generic doesn't work for her

## 2013-03-03 NOTE — Telephone Encounter (Signed)
Pt may have brand name Diflucan 150 mg daily x 3 days. If symptoms persist, is to see a GYN physician.

## 2013-03-03 NOTE — Telephone Encounter (Signed)
Patient informed Rx sent to pharmacy.

## 2013-03-03 NOTE — Telephone Encounter (Signed)
Due for PE Late July 2014. Please call her to book. Synthroid refilled for 90 days only.

## 2013-05-09 ENCOUNTER — Other Ambulatory Visit: Payer: BC Managed Care – PPO | Admitting: Internal Medicine

## 2013-05-09 DIAGNOSIS — E785 Hyperlipidemia, unspecified: Secondary | ICD-10-CM

## 2013-05-09 DIAGNOSIS — E039 Hypothyroidism, unspecified: Secondary | ICD-10-CM

## 2013-05-09 DIAGNOSIS — Z0000 Encounter for general adult medical examination without abnormal findings: Secondary | ICD-10-CM

## 2013-05-09 DIAGNOSIS — E559 Vitamin D deficiency, unspecified: Secondary | ICD-10-CM

## 2013-05-09 LAB — CBC WITH DIFFERENTIAL/PLATELET
Basophils Absolute: 0 K/uL (ref 0.0–0.1)
Basophils Relative: 0 % (ref 0–1)
Eosinophils Absolute: 0.1 K/uL (ref 0.0–0.7)
Eosinophils Relative: 2 % (ref 0–5)
HCT: 38.7 % (ref 36.0–46.0)
Hemoglobin: 13.1 g/dL (ref 12.0–15.0)
Lymphocytes Relative: 41 % (ref 12–46)
Lymphs Abs: 2.6 K/uL (ref 0.7–4.0)
MCH: 29.3 pg (ref 26.0–34.0)
MCHC: 33.9 g/dL (ref 30.0–36.0)
MCV: 86.6 fL (ref 78.0–100.0)
Monocytes Absolute: 0.6 K/uL (ref 0.1–1.0)
Monocytes Relative: 10 % (ref 3–12)
Neutro Abs: 3 K/uL (ref 1.7–7.7)
Neutrophils Relative %: 47 % (ref 43–77)
Platelets: 326 K/uL (ref 150–400)
RBC: 4.47 MIL/uL (ref 3.87–5.11)
RDW: 15.2 % (ref 11.5–15.5)
WBC: 6.3 K/uL (ref 4.0–10.5)

## 2013-05-09 LAB — COMPREHENSIVE METABOLIC PANEL
ALT: 20 U/L (ref 0–35)
AST: 18 U/L (ref 0–37)
Albumin: 3.9 g/dL (ref 3.5–5.2)
Calcium: 9.4 mg/dL (ref 8.4–10.5)
Chloride: 109 mEq/L (ref 96–112)
Creat: 0.87 mg/dL (ref 0.50–1.10)
Potassium: 4 mEq/L (ref 3.5–5.3)
Sodium: 142 mEq/L (ref 135–145)
Total Protein: 6.5 g/dL (ref 6.0–8.3)

## 2013-05-09 LAB — LIPID PANEL: Total CHOL/HDL Ratio: 3.5 Ratio

## 2013-05-09 LAB — TSH: TSH: 3.19 u[IU]/mL (ref 0.350–4.500)

## 2013-05-10 LAB — VITAMIN D 25 HYDROXY (VIT D DEFICIENCY, FRACTURES): Vit D, 25-Hydroxy: 67 ng/mL (ref 30–89)

## 2013-06-02 ENCOUNTER — Encounter: Payer: Self-pay | Admitting: Internal Medicine

## 2013-06-02 ENCOUNTER — Ambulatory Visit (INDEPENDENT_AMBULATORY_CARE_PROVIDER_SITE_OTHER): Payer: BC Managed Care – PPO | Admitting: Internal Medicine

## 2013-06-02 VITALS — BP 132/86 | HR 80 | Ht 63.0 in | Wt 185.0 lb

## 2013-06-02 DIAGNOSIS — Z8659 Personal history of other mental and behavioral disorders: Secondary | ICD-10-CM

## 2013-06-02 DIAGNOSIS — E785 Hyperlipidemia, unspecified: Secondary | ICD-10-CM

## 2013-06-02 DIAGNOSIS — Z8639 Personal history of other endocrine, nutritional and metabolic disease: Secondary | ICD-10-CM

## 2013-06-02 DIAGNOSIS — Z0000 Encounter for general adult medical examination without abnormal findings: Secondary | ICD-10-CM

## 2013-06-02 DIAGNOSIS — Z23 Encounter for immunization: Secondary | ICD-10-CM

## 2013-06-02 DIAGNOSIS — E039 Hypothyroidism, unspecified: Secondary | ICD-10-CM

## 2013-06-02 DIAGNOSIS — Z8709 Personal history of other diseases of the respiratory system: Secondary | ICD-10-CM

## 2013-06-02 LAB — POCT URINALYSIS DIPSTICK
Blood, UA: NEGATIVE
Glucose, UA: NEGATIVE
Nitrite, UA: NEGATIVE
Urobilinogen, UA: NEGATIVE

## 2013-06-02 MED ORDER — TETANUS-DIPHTH-ACELL PERTUSSIS 5-2.5-18.5 LF-MCG/0.5 IM SUSP: 0.5000 mL | Freq: Once | INTRAMUSCULAR | Status: DC

## 2013-06-02 MED ORDER — LEXAPRO 20 MG PO TABS: ORAL_TABLET | ORAL | Status: DC

## 2013-06-02 MED ORDER — ROSUVASTATIN CALCIUM 5 MG PO TABS: ORAL_TABLET | ORAL | Status: DC

## 2013-06-02 MED ORDER — MONTELUKAST SODIUM 10 MG PO TABS: ORAL_TABLET | ORAL | Status: DC

## 2013-06-02 MED ORDER — LEVOTHYROXINE SODIUM 50 MCG PO TABS: 50.0000 ug | ORAL_TABLET | Freq: Every day | ORAL | Status: DC

## 2013-06-02 NOTE — Patient Instructions (Addendum)
Continue same meds and return in 6 months 

## 2013-06-04 ENCOUNTER — Encounter: Payer: Self-pay | Admitting: Internal Medicine

## 2013-06-04 NOTE — Patient Instructions (Addendum)
Take Cipro 500 mg twice daily to complete 7 day course

## 2013-06-04 NOTE — Progress Notes (Signed)
  Subjective:    Patient ID: Mary Murillo, female    DOB: 1951/06/08, 62 y.o.   MRN: [id number]  HPI  62 year-old white female in  today with urinary frequency. Has started herself on some Cipro she had at home and is taken 3 doses of Cipro 500 mg. Unfortunately cannot get culture nor urine specimen today. Says she cannot void. She feels certain that this is an infection of the urinary tract. No fever or shaking chills.    Review of Systems     Objective:   Physical Exam no vaginal discharge        Assessment & Plan:  UTI-already on Cipro  Plan: Cipro 500 mg twice daily to complete 7 day course.
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
## 2013-09-08 ENCOUNTER — Other Ambulatory Visit: Payer: Self-pay | Admitting: Internal Medicine

## 2013-11-14 ENCOUNTER — Other Ambulatory Visit: Payer: Self-pay | Admitting: Internal Medicine

## 2014-02-25 ENCOUNTER — Other Ambulatory Visit: Payer: Self-pay | Admitting: Internal Medicine

## 2014-02-26 NOTE — Telephone Encounter (Signed)
Due for CPE after July 31. Please schedule now before refilling through August 2015.

## 2014-03-09 ENCOUNTER — Other Ambulatory Visit: Payer: Self-pay | Admitting: Internal Medicine

## 2014-03-09 ENCOUNTER — Ambulatory Visit (INDEPENDENT_AMBULATORY_CARE_PROVIDER_SITE_OTHER): Payer: BC Managed Care – PPO

## 2014-03-09 DIAGNOSIS — Z1231 Encounter for screening mammogram for malignant neoplasm of breast: Secondary | ICD-10-CM

## 2014-03-19 ENCOUNTER — Other Ambulatory Visit: Payer: Self-pay | Admitting: Internal Medicine

## 2014-03-21 ENCOUNTER — Other Ambulatory Visit: Payer: Self-pay | Admitting: Internal Medicine

## 2014-04-26 ENCOUNTER — Other Ambulatory Visit: Payer: Self-pay | Admitting: Internal Medicine

## 2014-06-05 ENCOUNTER — Other Ambulatory Visit: Payer: BC Managed Care – PPO | Admitting: Internal Medicine

## 2014-06-05 DIAGNOSIS — E785 Hyperlipidemia, unspecified: Secondary | ICD-10-CM

## 2014-06-05 DIAGNOSIS — E039 Hypothyroidism, unspecified: Secondary | ICD-10-CM

## 2014-06-05 DIAGNOSIS — E559 Vitamin D deficiency, unspecified: Secondary | ICD-10-CM

## 2014-06-05 DIAGNOSIS — Z79899 Other long term (current) drug therapy: Secondary | ICD-10-CM

## 2014-06-05 DIAGNOSIS — Z0000 Encounter for general adult medical examination without abnormal findings: Secondary | ICD-10-CM

## 2014-06-05 LAB — CBC WITH DIFFERENTIAL/PLATELET
Basophils Absolute: 0 10*3/uL (ref 0.0–0.1)
Basophils Relative: 0 % (ref 0–1)
EOS ABS: 0.1 10*3/uL (ref 0.0–0.7)
Eosinophils Relative: 2 % (ref 0–5)
HCT: 41.6 % (ref 36.0–46.0)
HEMOGLOBIN: 14.2 g/dL (ref 12.0–15.0)
LYMPHS ABS: 2.6 10*3/uL (ref 0.7–4.0)
LYMPHS PCT: 39 % (ref 12–46)
MCH: 29.9 pg (ref 26.0–34.0)
MCHC: 34.1 g/dL (ref 30.0–36.0)
MCV: 87.6 fL (ref 78.0–100.0)
Monocytes Absolute: 0.6 10*3/uL (ref 0.1–1.0)
Monocytes Relative: 9 % (ref 3–12)
Neutro Abs: 3.4 10*3/uL (ref 1.7–7.7)
Neutrophils Relative %: 50 % (ref 43–77)
PLATELETS: 331 10*3/uL (ref 150–400)
RBC: 4.75 MIL/uL (ref 3.87–5.11)
RDW: 14.7 % (ref 11.5–15.5)
WBC: 6.7 10*3/uL (ref 4.0–10.5)

## 2014-06-05 LAB — LIPID PANEL
CHOLESTEROL: 185 mg/dL (ref 0–200)
HDL: 54 mg/dL (ref >39)
LDL Cholesterol: 105 mg/dL — ABNORMAL HIGH (ref 0–99)
TRIGLYCERIDES: 132 mg/dL (ref <150)
Total CHOL/HDL Ratio: 3.4 Ratio
VLDL: 26 mg/dL (ref 0–40)

## 2014-06-05 LAB — COMPREHENSIVE METABOLIC PANEL
ALT: 21 U/L (ref 0–35)
AST: 17 U/L (ref 0–37)
Albumin: 4.1 g/dL (ref 3.5–5.2)
Alkaline Phosphatase: 102 U/L (ref 39–117)
BUN: 10 mg/dL (ref 6–23)
CALCIUM: 9.5 mg/dL (ref 8.4–10.5)
CHLORIDE: 106 meq/L (ref 96–112)
CO2: 26 meq/L (ref 19–32)
CREATININE: 0.68 mg/dL (ref 0.50–1.10)
GLUCOSE: 90 mg/dL (ref 70–99)
Potassium: 4.3 mEq/L (ref 3.5–5.3)
Sodium: 140 mEq/L (ref 135–145)
Total Bilirubin: 0.9 mg/dL (ref 0.2–1.2)
Total Protein: 6.8 g/dL (ref 6.0–8.3)

## 2014-06-05 LAB — TSH: TSH: 1.705 u[IU]/mL (ref 0.350–4.500)

## 2014-06-06 LAB — VITAMIN D 25 HYDROXY (VIT D DEFICIENCY, FRACTURES): Vit D, 25-Hydroxy: 70 ng/mL (ref 30–89)

## 2014-06-09 ENCOUNTER — Ambulatory Visit (INDEPENDENT_AMBULATORY_CARE_PROVIDER_SITE_OTHER): Payer: BC Managed Care – PPO | Admitting: Internal Medicine

## 2014-06-09 ENCOUNTER — Encounter: Payer: Self-pay | Admitting: Internal Medicine

## 2014-06-09 VITALS — BP 130/80 | HR 80 | Temp 98.4°F | Ht 63.0 in | Wt 188.0 lb

## 2014-06-09 DIAGNOSIS — Z87440 Personal history of urinary (tract) infections: Secondary | ICD-10-CM

## 2014-06-09 DIAGNOSIS — Z8639 Personal history of other endocrine, nutritional and metabolic disease: Secondary | ICD-10-CM

## 2014-06-09 DIAGNOSIS — Z8709 Personal history of other diseases of the respiratory system: Secondary | ICD-10-CM

## 2014-06-09 DIAGNOSIS — Z8659 Personal history of other mental and behavioral disorders: Secondary | ICD-10-CM

## 2014-06-09 DIAGNOSIS — J309 Allergic rhinitis, unspecified: Secondary | ICD-10-CM

## 2014-06-09 DIAGNOSIS — Z0000 Encounter for general adult medical examination without abnormal findings: Secondary | ICD-10-CM

## 2014-06-09 DIAGNOSIS — E039 Hypothyroidism, unspecified: Secondary | ICD-10-CM

## 2014-06-09 DIAGNOSIS — E785 Hyperlipidemia, unspecified: Secondary | ICD-10-CM

## 2014-06-09 LAB — POCT URINALYSIS DIPSTICK
BILIRUBIN UA: NEGATIVE
Blood, UA: NEGATIVE
Glucose, UA: NEGATIVE
KETONES UA: NEGATIVE
LEUKOCYTES UA: NEGATIVE
Nitrite, UA: NEGATIVE
Protein, UA: NEGATIVE
Spec Grav, UA: 1.015
Urobilinogen, UA: NEGATIVE
pH, UA: 5

## 2014-06-09 MED ORDER — ROSUVASTATIN CALCIUM 5 MG PO TABS: ORAL_TABLET | ORAL | Status: DC

## 2014-06-09 MED ORDER — ZOLPIDEM TARTRATE 10 MG PO TABS: 10.0000 mg | ORAL_TABLET | Freq: Every evening | ORAL | Status: DC | PRN

## 2014-06-09 MED ORDER — FUROSEMIDE 20 MG PO TABS: ORAL_TABLET | ORAL | Status: DC

## 2014-06-09 MED ORDER — MONTELUKAST SODIUM 10 MG PO TABS: ORAL_TABLET | ORAL | Status: DC

## 2014-06-09 MED ORDER — LEXAPRO 20 MG PO TABS: ORAL_TABLET | ORAL | Status: DC

## 2014-06-09 MED ORDER — LEVOTHYROXINE SODIUM 50 MCG PO TABS
ORAL_TABLET | ORAL | Status: DC
Start: 2014-06-09 — End: 2015-05-29

## 2014-06-09 NOTE — Patient Instructions (Signed)
Continue same meds and return in one year.
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
## 2014-07-11 ENCOUNTER — Ambulatory Visit (INDEPENDENT_AMBULATORY_CARE_PROVIDER_SITE_OTHER): Payer: BC Managed Care – PPO | Admitting: Internal Medicine

## 2014-07-11 ENCOUNTER — Encounter: Payer: Self-pay | Admitting: Internal Medicine

## 2014-07-11 VITALS — BP 148/80 | Temp 102.2°F

## 2014-07-11 DIAGNOSIS — R109 Unspecified abdominal pain: Secondary | ICD-10-CM | POA: Insufficient documentation

## 2014-07-11 DIAGNOSIS — N10 Acute tubulo-interstitial nephritis: Secondary | ICD-10-CM

## 2014-07-11 LAB — POCT URINALYSIS DIPSTICK
BILIRUBIN UA: NEGATIVE
GLUCOSE UA: NEGATIVE
Ketones, UA: NEGATIVE
NITRITE UA: NEGATIVE
Spec Grav, UA: 1.01
Urobilinogen, UA: NEGATIVE
pH, UA: 6.5

## 2014-07-11 LAB — CBC WITH DIFFERENTIAL/PLATELET
Basophils Absolute: 0 10*3/uL (ref 0.0–0.1)
Basophils Relative: 0 % (ref 0–1)
EOS PCT: 0 % (ref 0–5)
Eosinophils Absolute: 0 10*3/uL (ref 0.0–0.7)
HEMATOCRIT: 39.8 % (ref 36.0–46.0)
HEMOGLOBIN: 13.5 g/dL (ref 12.0–15.0)
LYMPHS ABS: 1.8 10*3/uL (ref 0.7–4.0)
LYMPHS PCT: 13 % (ref 12–46)
MCH: 30.5 pg (ref 26.0–34.0)
MCHC: 33.9 g/dL (ref 30.0–36.0)
MCV: 90 fL (ref 78.0–100.0)
MONOS PCT: 13 % — AB (ref 3–12)
Monocytes Absolute: 1.8 10*3/uL — ABNORMAL HIGH (ref 0.1–1.0)
Neutro Abs: 10.4 10*3/uL — ABNORMAL HIGH (ref 1.7–7.7)
Neutrophils Relative %: 74 % (ref 43–77)
PLATELETS: 370 10*3/uL (ref 150–400)
RBC: 4.42 MIL/uL (ref 3.87–5.11)
RDW: 14.6 % (ref 11.5–15.5)
WBC: 14.1 10*3/uL — AB (ref 4.0–10.5)

## 2014-07-11 MED ORDER — FLUCONAZOLE 150 MG PO TABS: 150.0000 mg | ORAL_TABLET | Freq: Once | ORAL | Status: DC

## 2014-07-11 MED ORDER — CEFTRIAXONE SODIUM 1 G IJ SOLR: 1.0000 g | Freq: Once | INTRAMUSCULAR | Status: DC

## 2014-07-11 MED ORDER — CEFTRIAXONE SODIUM 1 G IJ SOLR
1.0000 g | INTRAMUSCULAR | Status: DC
  Administered 2014-07-11: 1 g via INTRAMUSCULAR

## 2014-07-11 MED ORDER — CIPROFLOXACIN HCL 500 MG PO TABS: 500.0000 mg | ORAL_TABLET | Freq: Two times a day (BID) | ORAL | Status: DC

## 2014-07-11 NOTE — Patient Instructions (Signed)
You have been given 1 g IM Rocephin. Start Cipro 500 mg twice daily for 10 days. Return on Friday for recheck. Urine culture ordered. Call if symptoms worsen. Take Tylenol for fever. Drink plenty of fluids. Diflucan ordered should he develop Candida vaginitis.

## 2014-07-14 ENCOUNTER — Encounter: Payer: Self-pay | Admitting: Internal Medicine

## 2014-07-14 ENCOUNTER — Ambulatory Visit (INDEPENDENT_AMBULATORY_CARE_PROVIDER_SITE_OTHER): Payer: BC Managed Care – PPO | Admitting: Internal Medicine

## 2014-07-14 VITALS — BP 130/84 | HR 66 | Temp 98.8°F | Ht 63.0 in | Wt 184.0 lb

## 2014-07-14 DIAGNOSIS — N10 Acute tubulo-interstitial nephritis: Secondary | ICD-10-CM

## 2014-07-14 LAB — POCT URINALYSIS DIPSTICK
BILIRUBIN UA: NEGATIVE
GLUCOSE UA: NEGATIVE
KETONES UA: NEGATIVE
Leukocytes, UA: NEGATIVE
NITRITE UA: NEGATIVE
Protein, UA: NEGATIVE
Spec Grav, UA: 1.015
Urobilinogen, UA: NEGATIVE
pH, UA: 6

## 2014-07-14 MED ORDER — CIPROFLOXACIN HCL 500 MG PO TABS: 500.0000 mg | ORAL_TABLET | Freq: Two times a day (BID) | ORAL | Status: DC

## 2014-07-14 MED ORDER — NYSTATIN 500000 UNITS PO TABS: ORAL_TABLET | ORAL | Status: DC

## 2014-07-14 NOTE — Patient Instructions (Signed)
Finish course of antibiotics as prescribed. You have one refill to take a trip daily with you in November. Please give flu vaccine before trip to [place]. May take nystatin tablets with each dose of antibiotic.

## 2014-07-15 LAB — CULTURE, URINE COMPREHENSIVE

## 2014-07-15 NOTE — Progress Notes (Signed)
   Subjective:    Patient ID: Mary Murillo, female    DOB: Feb 17, 1951, 63 y.o.   MRN: [id number]  HPI Here today to followup on pyelonephritis. Urine culture grew coagulase-negative staph greater than 100,000 colonies per milliliter. She's beginning to feel better. No fever. Appetite is returning. Patient denies taking any antibiotics before coming to office for urinary infection. She would rather take nystatin tablets with each dose of antibiotic rather than have Diflucan. This was prescribed today. Also, patient and husband are going to [place] in November and she probably needs to take Cipro and nystatin on hand in case she gets another infection. Urinalysis today is clear.    Review of Systems     Objective:   Physical Exam  No CVA tenderness. She looks much better and has no total      Assessment & Plan:  Resolving pyelonephritis  Plan: Finish course of Cipro and take one dose of nystatin with each dose of antibiotic. New prescription for Cipro for 10 days to take a trip to [place] along with nystatin tablets. She does not want to take influenza immunization today.

## 2014-07-29 ENCOUNTER — Encounter: Payer: Self-pay | Admitting: Internal Medicine

## 2014-07-30 ENCOUNTER — Encounter: Payer: Self-pay | Admitting: Internal Medicine

## 2014-07-30 NOTE — Progress Notes (Signed)
   Subjective:    Patient ID: Mary Murillo, female    DOB: October 26, 1951, 63 y.o.   MRN: [id number]  HPI  Vague right lower quadrant pain and abdominal pain for several days. Has had some fever and chills. Also has pain in vaginal area particularly with urinating. Has multiple symptoms by history. Temperature is 102.2 today. Has also had some back pain.    Review of Systems     Objective:   Physical Exam  Slight right CVA tenderness. Vaginal exam is unremarkable. She is tender in the right lower quadrant without rebound tenderness. Urinalysis is abnormal. Culture obtained.      Assessment & Plan:  Likely has pyelonephritis  Plan: Rocephin 1 g IM. Begin Cipro 500 mg twice daily for 10 days. White blood cell count is 14,100. Stay well hydrated. Call if symptoms worsen. Return on Friday.  25 minutes spent with patient

## 2014-11-27 ENCOUNTER — Other Ambulatory Visit: Payer: Self-pay | Admitting: *Deleted

## 2014-11-27 MED ORDER — LEXAPRO 20 MG PO TABS: ORAL_TABLET | ORAL | Status: DC

## 2014-11-27 NOTE — Telephone Encounter (Signed)
Refill sent on patient Lexapro . Ok to dispense generic.

## 2015-02-05 ENCOUNTER — Other Ambulatory Visit: Payer: Self-pay | Admitting: Internal Medicine

## 2015-02-05 ENCOUNTER — Other Ambulatory Visit: Payer: Self-pay | Admitting: *Deleted

## 2015-02-05 DIAGNOSIS — Z1231 Encounter for screening mammogram for malignant neoplasm of breast: Secondary | ICD-10-CM

## 2015-02-18 ENCOUNTER — Other Ambulatory Visit: Payer: Self-pay | Admitting: Internal Medicine

## 2015-02-18 NOTE — Telephone Encounter (Signed)
Refill for 6 months. Due for CPE August

## 2015-02-19 ENCOUNTER — Other Ambulatory Visit: Payer: Self-pay | Admitting: *Deleted

## 2015-02-19 MED ORDER — MONTELUKAST SODIUM 10 MG PO TABS: ORAL_TABLET | ORAL | Status: DC

## 2015-03-14 ENCOUNTER — Ambulatory Visit: Payer: BC Managed Care – PPO

## 2015-03-17 ENCOUNTER — Other Ambulatory Visit: Payer: Self-pay | Admitting: Internal Medicine

## 2015-03-21 ENCOUNTER — Ambulatory Visit: Payer: BC Managed Care – PPO

## 2015-03-28 ENCOUNTER — Ambulatory Visit: Payer: BC Managed Care – PPO

## 2015-03-29 ENCOUNTER — Ambulatory Visit (INDEPENDENT_AMBULATORY_CARE_PROVIDER_SITE_OTHER): Payer: BC Managed Care – PPO

## 2015-03-29 DIAGNOSIS — Z1231 Encounter for screening mammogram for malignant neoplasm of breast: Secondary | ICD-10-CM

## 2015-04-04 ENCOUNTER — Ambulatory Visit: Payer: BC Managed Care – PPO

## 2015-05-01 ENCOUNTER — Ambulatory Visit (INDEPENDENT_AMBULATORY_CARE_PROVIDER_SITE_OTHER): Payer: Medicare Other | Admitting: Internal Medicine

## 2015-05-01 ENCOUNTER — Encounter: Payer: Self-pay | Admitting: Internal Medicine

## 2015-05-01 VITALS — BP 116/74 | HR 72 | Temp 97.8°F | Wt 178.5 lb

## 2015-05-01 DIAGNOSIS — R350 Frequency of micturition: Secondary | ICD-10-CM | POA: Diagnosis not present

## 2015-05-01 DIAGNOSIS — R8290 Unspecified abnormal findings in urine: Secondary | ICD-10-CM | POA: Diagnosis not present

## 2015-05-01 LAB — POCT URINALYSIS DIPSTICK
BILIRUBIN UA: NEGATIVE
Glucose, UA: NEGATIVE
Ketones, UA: NEGATIVE
NITRITE UA: NEGATIVE
Protein, UA: NEGATIVE
Urobilinogen, UA: NEGATIVE
pH, UA: 5

## 2015-05-01 MED ORDER — CIPROFLOXACIN HCL 500 MG PO TABS: 500.0000 mg | ORAL_TABLET | Freq: Two times a day (BID) | ORAL | Status: DC

## 2015-05-01 MED ORDER — NYSTATIN 500000 UNITS PO TABS: ORAL_TABLET | ORAL | Status: DC

## 2015-05-01 NOTE — Progress Notes (Signed)
   Subjective:    Patient ID: Mary Murillo, female    DOB: 1951-02-18, 64 y.o.   MRN: [id number]  HPI Patient developed onset of decreased urine output this morning. Can only produce a small amount of urine. Later on in the morning developed dysuria. No fever or shaking chills. Yesterday had bilateral back pain. Patient had pyelonephritis September 2015. She was treated with nystatin tablets and Cipro. Culture grew greater than 100,000 colonies per milliliter of coagulase-negative staph. This was thought to be a contaminant. Has not had recurrence since that time.    Review of Systems     Objective:   Physical Exam  No CVA tenderness. Dipstick urinalysis abnormal. Culture sent.      Assessment & Plan:  Acute urinary tract infection  Plan: Cipro 500 mg twice daily for 10 days. Refill nystatin tablets to take with each dose of antibiotically plus a refill. She likes to have not statin tablets on hand for Candida vaginitis.

## 2015-05-01 NOTE — Patient Instructions (Signed)
Take Cipro 500 mg twice daily for 10 days and nystatin tablets with each dose of anti-biotic. Urine culture is pending. It was a pleasure to see you today.

## 2015-05-03 LAB — URINE CULTURE: Colony Count: >=100000

## 2015-05-04 ENCOUNTER — Telehealth: Payer: Self-pay | Admitting: *Deleted

## 2015-05-04 NOTE — Telephone Encounter (Signed)
Reviewed urine culture results with patient. 

## 2015-05-16 ENCOUNTER — Other Ambulatory Visit: Payer: Self-pay | Admitting: *Deleted

## 2015-05-16 MED ORDER — LEXAPRO 20 MG PO TABS: ORAL_TABLET | ORAL | Status: DC

## 2015-05-29 ENCOUNTER — Other Ambulatory Visit: Payer: Self-pay | Admitting: Internal Medicine

## 2015-06-01 ENCOUNTER — Other Ambulatory Visit: Payer: Self-pay | Admitting: Internal Medicine

## 2015-06-15 ENCOUNTER — Other Ambulatory Visit: Payer: Medicare Other | Admitting: Internal Medicine

## 2015-06-15 DIAGNOSIS — E559 Vitamin D deficiency, unspecified: Secondary | ICD-10-CM

## 2015-06-15 DIAGNOSIS — E039 Hypothyroidism, unspecified: Secondary | ICD-10-CM

## 2015-06-15 DIAGNOSIS — Z79899 Other long term (current) drug therapy: Secondary | ICD-10-CM

## 2015-06-15 DIAGNOSIS — E785 Hyperlipidemia, unspecified: Secondary | ICD-10-CM

## 2015-06-15 DIAGNOSIS — R5383 Other fatigue: Secondary | ICD-10-CM

## 2015-06-15 LAB — COMPLETE METABOLIC PANEL WITH GFR
ALBUMIN: 3.8 g/dL (ref 3.6–5.1)
ALT: 15 U/L (ref 6–29)
AST: 17 U/L (ref 10–35)
Alkaline Phosphatase: 80 U/L (ref 33–130)
BUN: 9 mg/dL (ref 7–25)
CHLORIDE: 107 mmol/L (ref 98–110)
CO2: 25 mmol/L (ref 20–31)
Calcium: 9.6 mg/dL (ref 8.6–10.4)
Creat: 0.75 mg/dL (ref 0.50–0.99)
GFR, Est African American: >89 mL/min (ref >=60)
GFR, Est Non African American: 84 mL/min (ref >=60)
Glucose, Bld: 85 mg/dL (ref 65–99)
Potassium: 4.7 mmol/L (ref 3.5–5.3)
SODIUM: 141 mmol/L (ref 135–146)
TOTAL PROTEIN: 6.5 g/dL (ref 6.1–8.1)
Total Bilirubin: 0.9 mg/dL (ref 0.2–1.2)

## 2015-06-15 LAB — CBC WITH DIFFERENTIAL/PLATELET
BASOS ABS: 0 10*3/uL (ref 0.0–0.1)
Basophils Relative: 0 % (ref 0–1)
EOS ABS: 0.2 10*3/uL (ref 0.0–0.7)
EOS PCT: 3 % (ref 0–5)
HCT: 42.4 % (ref 36.0–46.0)
Hemoglobin: 13.8 g/dL (ref 12.0–15.0)
Lymphocytes Relative: 37 % (ref 12–46)
Lymphs Abs: 2.3 10*3/uL (ref 0.7–4.0)
MCH: 29.1 pg (ref 26.0–34.0)
MCHC: 32.5 g/dL (ref 30.0–36.0)
MCV: 89.5 fL (ref 78.0–100.0)
MPV: 9.4 fL (ref 8.6–12.4)
Monocytes Absolute: 0.6 10*3/uL (ref 0.1–1.0)
Monocytes Relative: 10 % (ref 3–12)
Neutro Abs: 3.1 10*3/uL (ref 1.7–7.7)
Neutrophils Relative %: 50 % (ref 43–77)
Platelets: 374 10*3/uL (ref 150–400)
RBC: 4.74 MIL/uL (ref 3.87–5.11)
RDW: 14.5 % (ref 11.5–15.5)
WBC: 6.2 10*3/uL (ref 4.0–10.5)

## 2015-06-15 LAB — TSH: TSH: 2.728 u[IU]/mL (ref 0.350–4.500)

## 2015-06-15 LAB — LIPID PANEL
Cholesterol: 173 mg/dL (ref 125–200)
HDL: 43 mg/dL — ABNORMAL LOW (ref >=46)
LDL CALC: 106 mg/dL (ref <130)
Total CHOL/HDL Ratio: 4 Ratio (ref <=5.0)
Triglycerides: 121 mg/dL (ref <150)
VLDL: 24 mg/dL (ref <30)

## 2015-06-16 LAB — VITAMIN D 25 HYDROXY (VIT D DEFICIENCY, FRACTURES): VIT D 25 HYDROXY: 45 ng/mL (ref 30–100)

## 2015-06-18 ENCOUNTER — Other Ambulatory Visit: Payer: Medicare Other | Admitting: Internal Medicine

## 2015-06-19 ENCOUNTER — Ambulatory Visit (INDEPENDENT_AMBULATORY_CARE_PROVIDER_SITE_OTHER): Payer: Medicare Other | Admitting: Internal Medicine

## 2015-06-19 ENCOUNTER — Encounter: Payer: Self-pay | Admitting: Internal Medicine

## 2015-06-19 VITALS — BP 118/80 | HR 76 | Temp 97.8°F | Ht 63.0 in | Wt 177.0 lb

## 2015-06-19 DIAGNOSIS — M8580 Other specified disorders of bone density and structure, unspecified site: Secondary | ICD-10-CM | POA: Diagnosis not present

## 2015-06-19 DIAGNOSIS — Z0000 Encounter for general adult medical examination without abnormal findings: Secondary | ICD-10-CM

## 2015-06-19 LAB — POCT URINALYSIS DIPSTICK
BILIRUBIN UA: NEGATIVE
Blood, UA: NEGATIVE
GLUCOSE UA: NEGATIVE
KETONES UA: NEGATIVE
Leukocytes, UA: NEGATIVE
Nitrite, UA: NEGATIVE
Protein, UA: NEGATIVE
SPEC GRAV UA: 1.01
Urobilinogen, UA: NEGATIVE
pH, UA: 6

## 2015-06-19 MED ORDER — ROSUVASTATIN CALCIUM 5 MG PO TABS: ORAL_TABLET | ORAL | Status: DC

## 2015-06-19 MED ORDER — NYSTATIN 500000 UNITS PO TABS: ORAL_TABLET | ORAL | Status: DC

## 2015-06-19 MED ORDER — LEVOTHYROXINE SODIUM 50 MCG PO TABS: ORAL_TABLET | ORAL | Status: DC

## 2015-06-19 MED ORDER — MONTELUKAST SODIUM 10 MG PO TABS: 10.0000 mg | ORAL_TABLET | Freq: Every day | ORAL | Status: DC

## 2015-06-19 MED ORDER — ZOLPIDEM TARTRATE 10 MG PO TABS: 10.0000 mg | ORAL_TABLET | Freq: Every evening | ORAL | Status: DC | PRN

## 2015-06-19 NOTE — Patient Instructions (Signed)
Bone density study. Refill meds and RTC in one year.
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
## 2015-08-09 ENCOUNTER — Encounter: Payer: Self-pay | Admitting: Internal Medicine

## 2015-08-20 ENCOUNTER — Telehealth: Payer: Self-pay

## 2015-08-20 NOTE — Telephone Encounter (Signed)
Left message for patient to call office to schedule a Prevnar vaccine.

## 2015-08-21 ENCOUNTER — Encounter: Payer: Self-pay | Admitting: Internal Medicine

## 2015-08-21 ENCOUNTER — Ambulatory Visit (INDEPENDENT_AMBULATORY_CARE_PROVIDER_SITE_OTHER): Payer: Medicare Other | Admitting: Internal Medicine

## 2015-08-21 VITALS — BP 122/82 | HR 78 | Temp 98.8°F | Ht 63.0 in | Wt 177.0 lb

## 2015-08-21 DIAGNOSIS — Z23 Encounter for immunization: Secondary | ICD-10-CM

## 2015-08-21 NOTE — Progress Notes (Signed)
Patient seen today for prevnar vaccine.  Patient tolerated well.

## 2015-09-15 ENCOUNTER — Other Ambulatory Visit: Payer: Self-pay | Admitting: Internal Medicine

## 2016-03-03 ENCOUNTER — Ambulatory Visit (INDEPENDENT_AMBULATORY_CARE_PROVIDER_SITE_OTHER): Payer: Medicare Other | Admitting: Internal Medicine

## 2016-03-03 VITALS — BP 122/80 | HR 75 | Temp 97.9°F | Resp 16 | Ht 63.0 in | Wt 168.4 lb

## 2016-03-03 DIAGNOSIS — W57XXXA Bitten or stung by nonvenomous insect and other nonvenomous arthropods, initial encounter: Secondary | ICD-10-CM | POA: Diagnosis not present

## 2016-03-03 DIAGNOSIS — R21 Rash and other nonspecific skin eruption: Secondary | ICD-10-CM | POA: Diagnosis not present

## 2016-03-03 NOTE — Progress Notes (Signed)
By signing my name below I, Tereasa Coop, attest that this documentation has been prepared under the direction and in the presence of Tami Lin, MD. Electonically Signed. Tereasa Coop, Scribe 03/03/2016 at 2:49 PM  Subjective:    Patient ID: Mary Murillo, female    DOB: 26-May-1951, 65 y.o.   MRN: [id number]  Chief Complaint  Patient presents with  . tick bite    deer tick, found on 02/21/16, started having redness x 5 days ago     HPI Mary Murillo is a 65 y.o. female who presents to the [facility] complaining of redness for the past 5 days on her anterior left thigh. Pt states that she found a small tick on her left anterior thigh on (02/21/16) where she has current redness. Pt states that area of erythema was pruritic and had clear discharge. Pt also reports that the center was firm. Pt states redness and itching are improved. Pt also reports that the tick bite area is no longer firm.  Pt states she has been checking her temperature and denies any fever or HA occuring since the tick was found.   Pt states that she has been putting charcoal over tick bite.   Pt denies any recent travels.  Pt states that she believes she found and removed the tick within a few hours of the initial bite.  It was not engorged.    Patient Active Problem List   Diagnosis Date Noted  . Insomnia 05/29/2012  . History of asthma 05/29/2012  . Hypothyroidism 04/30/2011  . Hyperlipidemia 04/30/2011  . Vitamin D deficiency 04/30/2011  . Allergic rhinitis 04/30/2011  . Depression 04/30/2011     Current outpatient prescriptions:  .  cetirizine (ZYRTEC) 10 MG tablet, Take 10 mg by mouth daily.  , Disp: , Rfl:  .  Cholecalciferol (VITAMIN D) 2000 UNITS tablet, Take 2,000 Units by mouth daily.  , Disp: , Rfl:  .  furosemide (LASIX) 20 MG tablet, TAKE 1 TABLET BY MOUTH AS NEEDED FOR EDEMA, Disp: 30 tablet, Rfl: 1 .  levothyroxine (SYNTHROID, LEVOTHROID) 50 MCG tablet, TAKE 1 TABLET  (50 MCG TOTAL) BY MOUTH DAILY BEFORE BREAKFAST., Disp: 90 tablet, Rfl: 0 .  LEXAPRO 20 MG tablet, TAKE 1 TABLET BY MOUTH EVERY DAY, Disp: 90 tablet, Rfl: 3 .  montelukast (SINGULAIR) 10 MG tablet, TAKE 1 TABLET BY MOUTH EVERY DAY, Disp: 90 tablet, Rfl: 1 .  nystatin (MYCOSTATIN) 500000 UNITS TABS tablet, Take one tablet with each dose of antibiotic, Disp: 20 tablet, Rfl: 1  Allergies  Allergen Reactions  . Other     Stainless steel- SURGICAL STAPLES  . Terazol [Terconazole]       Review of Systems  Constitutional: Negative for fever.  Skin: Positive for rash (pruritic area of erythema on left anterior thigh.).  Neurological: Negative for headaches.       Objective:   Physical Exam  Constitutional: She is oriented to person, place, and time. She appears well-developed and well-nourished. No distress.  HENT:  Head: Normocephalic and atraumatic.  Eyes: Conjunctivae are normal. Pupils are equal, round, and reactive to light.  Neck: Neck supple.  Cardiovascular: Normal rate.   Pulmonary/Chest: Effort normal.  Musculoskeletal: Normal range of motion.  Neurological: She is alert and oriented to person, place, and time.  Skin: Skin is warm and dry.  Pt has a 1.5 cm diameter area maculopapular lesion on her left anterior thigh with a central ulceration. Area is non  tender, no nodularity, no cellulitis.  No halo. No necrosis  Psychiatric: She has a normal mood and affect. Her behavior is normal.  Nursing note and vitals reviewed.    Filed Vitals:   03/03/16 1333  BP: 122/80  Pulse: 75  Temp: 97.9 F (36.6 C)  TempSrc: Oral  Resp: 16  Height: 5\' 3"  (1.6 m)  Weight: 168 lb 6.4 oz (76.386 kg)  SpO2: 98%        Assessment & Plan:  Tick bite  The best plan is for observation without treatment over the next 2 weeks with serological testing 2-4 weeks if desired or needed for confirmation  I have completed the patient encounter in its entirety as documented by the scribe,  with editing by me where necessary. Teagon Kron P. Laney Pastor, M.D.

## 2016-04-08 ENCOUNTER — Ambulatory Visit (INDEPENDENT_AMBULATORY_CARE_PROVIDER_SITE_OTHER): Payer: Medicare Other | Admitting: Internal Medicine

## 2016-04-08 ENCOUNTER — Encounter: Payer: Self-pay | Admitting: Internal Medicine

## 2016-04-08 VITALS — BP 134/84 | HR 67 | Temp 98.1°F | Resp 18 | Wt 168.0 lb

## 2016-04-08 DIAGNOSIS — W57XXXA Bitten or stung by nonvenomous insect and other nonvenomous arthropods, initial encounter: Secondary | ICD-10-CM | POA: Diagnosis not present

## 2016-04-08 DIAGNOSIS — T148 Other injury of unspecified body region: Secondary | ICD-10-CM

## 2016-04-08 NOTE — Patient Instructions (Signed)
Lyme titers drawn and pending.

## 2016-04-08 NOTE — Progress Notes (Signed)
   Subjective:    Patient ID: Mary Murillo, female    DOB: [birth year], 65 y.o.   MRN: [id number]  HPI 65 year old Female found a tick on her leg around April 20. She went to urgent medical care and saw Dr. Laney Pastor. She had done bite described as 1.5 cm area that was maculopapular with a central ulceration anterior left leg. As she had no other symptoms, he advised her to wait a Lyme titers. Now she wants Lyme titers drawn. Says she's had some arthralgias tickly in her knees. Says her daughter has chronic Lyme disease and lives in [place]. She has no headache.    Review of Systems see above     Objective:   Physical Exam  Currently has no rash. No swollen joints.      Assessment & Plan:  Tick exposure late April  Plan: Lyme titers drawn at patient request.

## 2016-04-10 LAB — LYME ABY, WSTRN BLT IGG & IGM W/BANDS
B BURGDORFERI IGG ABS (IB): NEGATIVE
B BURGDORFERI IGM ABS (IB): NEGATIVE
LYME DISEASE 23 KD IGM: NONREACTIVE
LYME DISEASE 39 KD IGG: NONREACTIVE
LYME DISEASE 41 KD IGG: NONREACTIVE
LYME DISEASE 58 KD IGG: NONREACTIVE
LYME DISEASE 66 KD IGG: NONREACTIVE
Lyme Disease 18 kD IgG: NONREACTIVE
Lyme Disease 23 kD IgG: NONREACTIVE
Lyme Disease 28 kD IgG: NONREACTIVE
Lyme Disease 30 kD IgG: NONREACTIVE
Lyme Disease 39 kD IgM: NONREACTIVE
Lyme Disease 41 kD IgM: NONREACTIVE
Lyme Disease 45 kD IgG: NONREACTIVE
Lyme Disease 93 kD IgG: NONREACTIVE

## 2016-05-29 ENCOUNTER — Telehealth: Payer: Self-pay | Admitting: Internal Medicine

## 2016-05-29 NOTE — Telephone Encounter (Signed)
Patient in [place], [place].  Forgot her Levothyroxine and Lexapro.  Patient requested that we call in [facility] @ [phone].  Called Levothyroxine 50 mcg #3, no refill. Lexapro (generic) 20mg , #3, no refill. [phone]

## 2016-06-19 ENCOUNTER — Other Ambulatory Visit: Payer: Medicare Other | Admitting: Internal Medicine

## 2016-06-19 DIAGNOSIS — F329 Major depressive disorder, single episode, unspecified: Secondary | ICD-10-CM

## 2016-06-19 DIAGNOSIS — E559 Vitamin D deficiency, unspecified: Secondary | ICD-10-CM

## 2016-06-19 DIAGNOSIS — E785 Hyperlipidemia, unspecified: Secondary | ICD-10-CM

## 2016-06-19 DIAGNOSIS — F32A Depression, unspecified: Secondary | ICD-10-CM

## 2016-06-19 DIAGNOSIS — E039 Hypothyroidism, unspecified: Secondary | ICD-10-CM

## 2016-06-19 DIAGNOSIS — Z0000 Encounter for general adult medical examination without abnormal findings: Secondary | ICD-10-CM

## 2016-06-19 LAB — CBC WITH DIFFERENTIAL/PLATELET
Basophils Absolute: 0 cells/uL (ref 0–200)
Basophils Relative: 0 %
EOS ABS: 108 {cells}/uL (ref 15–500)
Eosinophils Relative: 2 %
HEMATOCRIT: 42.9 % (ref 35.0–45.0)
Hemoglobin: 14.1 g/dL (ref 11.7–15.5)
LYMPHS PCT: 37 %
Lymphs Abs: 1998 cells/uL (ref 850–3900)
MCH: 29.9 pg (ref 27.0–33.0)
MCHC: 32.9 g/dL (ref 32.0–36.0)
MCV: 91.1 fL (ref 80.0–100.0)
MONO ABS: 594 {cells}/uL (ref 200–950)
MPV: 10 fL (ref 7.5–12.5)
Monocytes Relative: 11 %
NEUTROS PCT: 50 %
Neutro Abs: 2700 cells/uL (ref 1500–7800)
Platelets: 329 10*3/uL (ref 140–400)
RBC: 4.71 MIL/uL (ref 3.80–5.10)
RDW: 14.1 % (ref 11.0–15.0)
WBC: 5.4 10*3/uL (ref 3.8–10.8)

## 2016-06-19 LAB — COMPLETE METABOLIC PANEL WITH GFR
ALT: 20 U/L (ref 6–29)
AST: 23 U/L (ref 10–35)
Albumin: 3.8 g/dL (ref 3.6–5.1)
Alkaline Phosphatase: 87 U/L (ref 33–130)
BUN: 7 mg/dL (ref 7–25)
CALCIUM: 9.5 mg/dL (ref 8.6–10.4)
CHLORIDE: 108 mmol/L (ref 98–110)
CO2: 24 mmol/L (ref 20–31)
Creat: 0.76 mg/dL (ref 0.50–0.99)
GFR, EST NON AFRICAN AMERICAN: 82 mL/min (ref >=60)
Glucose, Bld: 87 mg/dL (ref 65–99)
POTASSIUM: 4.5 mmol/L (ref 3.5–5.3)
Sodium: 141 mmol/L (ref 135–146)
Total Bilirubin: 0.8 mg/dL (ref 0.2–1.2)
Total Protein: 6.6 g/dL (ref 6.1–8.1)

## 2016-06-19 LAB — TSH: TSH: 2.81 mIU/L

## 2016-06-19 LAB — LIPID PANEL
CHOL/HDL RATIO: 3 ratio (ref <=5.0)
CHOLESTEROL: 186 mg/dL (ref 125–200)
HDL: 61 mg/dL (ref >=46)
LDL Cholesterol: 111 mg/dL (ref <130)
TRIGLYCERIDES: 72 mg/dL (ref <150)
VLDL: 14 mg/dL (ref <30)

## 2016-06-20 ENCOUNTER — Other Ambulatory Visit: Payer: Medicare Other | Admitting: Internal Medicine

## 2016-06-20 LAB — VITAMIN D 25 HYDROXY (VIT D DEFICIENCY, FRACTURES): VIT D 25 HYDROXY: 79 ng/mL (ref 30–100)

## 2016-06-23 ENCOUNTER — Encounter: Payer: Self-pay | Admitting: Internal Medicine

## 2016-06-23 ENCOUNTER — Ambulatory Visit (INDEPENDENT_AMBULATORY_CARE_PROVIDER_SITE_OTHER): Payer: Medicare Other | Admitting: Internal Medicine

## 2016-06-23 VITALS — BP 124/86 | HR 84 | Temp 97.5°F | Ht 63.0 in | Wt 169.5 lb

## 2016-06-23 DIAGNOSIS — E039 Hypothyroidism, unspecified: Secondary | ICD-10-CM | POA: Diagnosis not present

## 2016-06-23 DIAGNOSIS — Z87440 Personal history of urinary (tract) infections: Secondary | ICD-10-CM

## 2016-06-23 DIAGNOSIS — Z8639 Personal history of other endocrine, nutritional and metabolic disease: Secondary | ICD-10-CM

## 2016-06-23 DIAGNOSIS — J309 Allergic rhinitis, unspecified: Secondary | ICD-10-CM

## 2016-06-23 DIAGNOSIS — Z8709 Personal history of other diseases of the respiratory system: Secondary | ICD-10-CM

## 2016-06-23 DIAGNOSIS — Z8659 Personal history of other mental and behavioral disorders: Secondary | ICD-10-CM | POA: Diagnosis not present

## 2016-06-23 DIAGNOSIS — Z0000 Encounter for general adult medical examination without abnormal findings: Secondary | ICD-10-CM

## 2016-06-23 LAB — POCT URINALYSIS DIPSTICK
Bilirubin, UA: NEGATIVE
Blood, UA: NEGATIVE
Glucose, UA: NEGATIVE
Ketones, UA: NEGATIVE
LEUKOCYTES UA: NEGATIVE
NITRITE UA: NEGATIVE
PH UA: 5
PROTEIN UA: NEGATIVE
Spec Grav, UA: <=1.005
UROBILINOGEN UA: 0.2

## 2016-06-23 MED ORDER — MONTELUKAST SODIUM 10 MG PO TABS: 10.0000 mg | ORAL_TABLET | Freq: Every day | ORAL | 1 refills | Status: DC

## 2016-06-23 NOTE — Progress Notes (Signed)
Subjective:    Patient ID: Mary Murillo, female    DOB: [birth year], 65 y.o.   MRN: [id number]  HPI 65 year old White female for health maintenance exam and evaluation of medical issues. Stopped wheat and dairy products. Lipids are normal off Crestor. She has a history of hypothyroidism, hyperlipidemia, depression, asthma, allergic rhinitis, recurrent urinary infections, history of vitamin D deficiency, history of Candida infections due to antibiotic therapy.  Currently feels well.  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 History of tonsillectomy and adenoidectomy in 1956. C-section 1982. Oral mass removed under her tongue in 2003. Hysterectomy without oophorectomy 1991.  Colonoscopy done by Dr. Olevia Perches July 2011 with 10 year follow-up recommended.  Zostavax vaccine May 2012. Pneumococcal 23 given in 2002. Prevnar October 2016. Tetanus immunization July 2014.  Family history: Parents in good health. One sister died at age 83 of metastatic breast cancer.  Social history: Married with 2 adult daughters. Does not smoke. Social alcohol consumption. Husband with history of colon cancer. He teaches at [employer] day school. 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   Review of Systems  Constitutional: Negative.   All other systems reviewed and are negative.      Objective:   Physical Exam  Constitutional: She is oriented to person, place, and time. She appears well-developed and well-nourished. No distress.  HENT:  Head: Normocephalic and atraumatic.  Right Ear: External ear normal.  Left Ear: External ear normal.  Mouth/Throat: Oropharynx is clear and moist.  Eyes: Conjunctivae and EOM are normal. Pupils are equal, round, and reactive to light.  Neck: Neck supple. No JVD present.  Pulmonary/Chest: Effort normal and breath sounds normal. She  has no wheezes.  Breasts normal female  Abdominal: Soft. Bowel sounds are normal. She exhibits no distension and no mass. There is no tenderness. There is no rebound and no guarding.  Lymphadenopathy:    She has no cervical adenopathy.  Neurological: She is alert and oriented to person, place, and time. She has normal reflexes. No cranial nerve deficit. Coordination normal.  Skin: Skin is warm and dry. No rash noted. She is not diaphoretic.  Psychiatric: She has a normal mood and affect. Her behavior is normal. Judgment and thought content normal.  Vitals reviewed.         Assessment & Plan:  Hypothyroidism-stable on current dose of thyroid replacement  History of hyperlipidemia-lipid panel is normal off statin medication. She does not want to resume it at the present time  History of recurrent urinary infections  History of depression-treated with Lexapro  Allergic rhinitis  History of asthma  History of vitamin D deficiency  Plan: Return in one year or as needed. Recommend annual mammogram and annual flu vaccine.  Subjective:   Patient presents for Medicare Annual/Subsequent preventive examination.  Review Past Medical/Family/Social:See above   Risk Factors  Current exercise habits: Exercises regularly Dietary issues discussed: Low fat low carbohydrate  Cardiac risk factors:Hyperlipidemia- currently normal lipid panel  Depression Screen  (Note: if answer to either of the following is "Yes", a more complete depression screening is indicated)   Over the past two weeks, have you felt down, depressed or hopeless? No  Over the past two weeks, have you felt little interest or pleasure in doing things? No Have you lost interest or pleasure in daily life? No Do  you often feel hopeless? No Do you cry easily over simple problems? No   Activities of Daily Living  In your present state of health, do you have any difficulty performing the following activities?:   Driving?  No  Managing money? No  Feeding yourself? No  Getting from bed to chair? No  Climbing a flight of stairs? No  Preparing food and eating?: No  Bathing or showering? No  Getting dressed: No  Getting to the toilet? No  Using the toilet:No  Moving around from place to place: No  In the past year have you fallen or had a near fall?:No  Are you sexually active? yes Do you have more than one partner? No   Hearing Difficulties: No  Do you often ask people to speak up or repeat themselves? Yes Do you experience ringing or noises in your ears? Yes Do you have difficulty understanding soft or whispered voices? Yes Do you feel that you have a problem with memory? No Do you often misplace items? No    Home Safety:  Do you have a smoke alarm at your residence? Yes Do you have grab bars in the bathroom?No Do you have throw rugs in your house? Yes   Cognitive Testing  Alert? Yes Normal Appearance?Yes  Oriented to person? Yes Place? Yes  Time? Yes  Recall of three objects? Yes  Can perform simple calculations? Yes  Displays appropriate judgment?Yes  Can read the correct time from a watch face?Yes   List the Names of Other Physician/Practitioners you currently use:  See referral list for the physicians patient is currently seeing.     Review of Systems: See above   Objective:     General appearance: Appears stated age  Head: Normocephalic, without obvious abnormality, atraumatic  Eyes: conj clear, EOMi PEERLA  Ears: normal TM's and external ear canals both ears  Nose: Nares normal. Septum midline. Mucosa normal. No drainage or sinus tenderness.  Throat: lips, mucosa, and tongue normal; teeth and gums normal  Neck: no adenopathy, no carotid bruit, no JVD, supple, symmetrical, trachea midline and thyroid not enlarged, symmetric, no tenderness/mass/nodules  No CVA tenderness.  Lungs: clear to auscultation bilaterally  Breasts: normal appearance, no masses or tenderness Heart:  regular rate and rhythm, S1, S2 normal, no murmur, click, rub or gallop  Abdomen: soft, non-tender; bowel sounds normal; no masses, no organomegaly  Musculoskeletal: ROM normal in all joints, no crepitus, no deformity, Normal muscle strengthen. Back  is symmetric, no curvature. Skin: Skin color, texture, turgor normal. No rashes or lesions  Lymph nodes: Cervical, supraclavicular, and axillary nodes normal.  Neurologic: CN 2 -12 Normal, Normal symmetric reflexes. Normal coordination and gait  Psych: Alert & Oriented x 3, Mood appear stable.    Assessment:    Annual wellness medicare exam   Plan:    During the course of the visit the patient was educated and counseled about appropriate screening and preventive services including:   Annual mammogram  Annual flu vaccine     Patient Instructions (the written plan) was given to the patient.  Medicare Attestation  I have personally reviewed:  The patient's medical and social history  Their use of alcohol, tobacco or illicit drugs  Their current medications and supplements  The patient's functional ability including ADLs,fall risks, home safety risks, cognitive, and hearing and visual impairment  Diet and physical activities  Evidence for depression or mood disorders  The patient's weight, height, BMI, and visual acuity have been recorded  in the chart. I have made referrals, counseling, and provided education to the patient based on review of the above and I have provided the patient with a written personalized care plan for preventive services.

## 2016-06-25 ENCOUNTER — Other Ambulatory Visit: Payer: Self-pay | Admitting: Internal Medicine

## 2016-06-27 NOTE — Patient Instructions (Signed)
It was pleasure to see you today. Stay off of Crestor. Follow-up in one year. Continue other medications as previously prescribed.

## 2016-07-28 ENCOUNTER — Ambulatory Visit (INDEPENDENT_AMBULATORY_CARE_PROVIDER_SITE_OTHER): Payer: Medicare Other | Admitting: Internal Medicine

## 2016-07-28 ENCOUNTER — Encounter: Payer: Self-pay | Admitting: Internal Medicine

## 2016-07-28 VITALS — BP 128/74 | HR 82 | Temp 99.1°F | Wt 165.0 lb

## 2016-07-28 DIAGNOSIS — R509 Fever, unspecified: Secondary | ICD-10-CM | POA: Diagnosis not present

## 2016-07-28 DIAGNOSIS — R319 Hematuria, unspecified: Secondary | ICD-10-CM

## 2016-07-28 LAB — POCT URINALYSIS DIPSTICK
Bilirubin, UA: NEGATIVE
Glucose, UA: NEGATIVE
KETONES UA: NEGATIVE
NITRITE UA: NEGATIVE
PH UA: 7
PROTEIN UA: NEGATIVE
Spec Grav, UA: <=1.005
UROBILINOGEN UA: NEGATIVE

## 2016-07-28 LAB — CBC WITH DIFFERENTIAL/PLATELET
BASOS ABS: 54 {cells}/uL (ref 0–200)
Basophils Relative: 1 %
EOS PCT: 2 %
Eosinophils Absolute: 108 cells/uL (ref 15–500)
HCT: 44.3 % (ref 35.0–45.0)
Hemoglobin: 15.2 g/dL (ref 11.7–15.5)
Lymphocytes Relative: 23 %
Lymphs Abs: 1242 cells/uL (ref 850–3900)
MCH: 30.5 pg (ref 27.0–33.0)
MCHC: 34.3 g/dL (ref 32.0–36.0)
MCV: 88.8 fL (ref 80.0–100.0)
MONOS PCT: 13 %
MPV: 10.3 fL (ref 7.5–12.5)
Monocytes Absolute: 702 cells/uL (ref 200–950)
NEUTROS ABS: 3294 {cells}/uL (ref 1500–7800)
Neutrophils Relative %: 61 %
PLATELETS: 224 10*3/uL (ref 140–400)
RBC: 4.99 MIL/uL (ref 3.80–5.10)
RDW: 13.7 % (ref 11.0–15.0)
WBC: 5.4 10*3/uL (ref 3.8–10.8)

## 2016-07-28 MED ORDER — CIPROFLOXACIN HCL 250 MG PO TABS: 250.0000 mg | ORAL_TABLET | Freq: Two times a day (BID) | ORAL | 0 refills | Status: DC

## 2016-07-28 MED ORDER — FLUCONAZOLE 150 MG PO TABS: ORAL_TABLET | ORAL | 0 refills | Status: DC

## 2016-07-28 NOTE — Patient Instructions (Addendum)
Cipro 250 mg twice daily for 10 days. Diflucan for Candida vaginitis symptoms should they develop while on Cipro. Urine culture pending. CBC with differential drawn and pending.

## 2016-07-28 NOTE — Progress Notes (Signed)
   Subjective:    Patient ID: Mary Murillo, female    DOB: 08-27-1951, 65 y.o.   MRN: [id number]  HPI patient developed fever late last week without shaking chills. No nausea or vomiting. No back pain. She has a history of similar presentation September 2015 and June 2016. In September 2015 checks [place] had pyelonephritis. Culture June 2016 grew greater than 100,000 colonies per milliliter Enterobacter cloacae. Urine culture September 2015: Grew staph coagulase negative species but she clearly had pyelonephritis.    Review of Systems     Objective:   Physical Exam No CVA tenderness. Urinalysis abnormal and culture was sent. Has moderate blood on urine dipstick and small LE.       Assessment & Plan:  Acute urinary tract infection  Plan: Cipro 250 mg twice daily for 10 days. Diflucan 150 mg tablets #3. She'll take one third of a tablet daily for Candida vaginitis. Says it 500 mg of Cipro makes her feel weird so we cut the dose daily. Urine culture pending. CBC with differential drawn.

## 2016-07-29 LAB — URINE CULTURE: Organism ID, Bacteria: NO GROWTH

## 2016-08-05 ENCOUNTER — Other Ambulatory Visit: Payer: Self-pay | Admitting: Internal Medicine

## 2016-08-05 DIAGNOSIS — Z9289 Personal history of other medical treatment: Secondary | ICD-10-CM

## 2016-08-12 ENCOUNTER — Ambulatory Visit: Payer: Medicare Other | Admitting: Internal Medicine

## 2016-08-13 ENCOUNTER — Ambulatory Visit (INDEPENDENT_AMBULATORY_CARE_PROVIDER_SITE_OTHER): Payer: Medicare Other

## 2016-08-13 DIAGNOSIS — M85851 Other specified disorders of bone density and structure, right thigh: Secondary | ICD-10-CM

## 2016-08-13 DIAGNOSIS — Z1231 Encounter for screening mammogram for malignant neoplasm of breast: Secondary | ICD-10-CM

## 2016-08-13 DIAGNOSIS — M8580 Other specified disorders of bone density and structure, unspecified site: Secondary | ICD-10-CM

## 2016-08-13 DIAGNOSIS — Z9289 Personal history of other medical treatment: Secondary | ICD-10-CM

## 2016-12-22 ENCOUNTER — Other Ambulatory Visit: Payer: Self-pay | Admitting: Internal Medicine

## 2017-01-05 ENCOUNTER — Other Ambulatory Visit: Payer: Self-pay | Admitting: Internal Medicine

## 2017-02-18 ENCOUNTER — Ambulatory Visit (INDEPENDENT_AMBULATORY_CARE_PROVIDER_SITE_OTHER): Payer: Medicare Other | Admitting: Podiatry

## 2017-02-18 VITALS — BP 150/95 | HR 70

## 2017-02-18 DIAGNOSIS — M79676 Pain in unspecified toe(s): Secondary | ICD-10-CM

## 2017-02-18 DIAGNOSIS — B351 Tinea unguium: Secondary | ICD-10-CM | POA: Diagnosis not present

## 2017-02-18 NOTE — Progress Notes (Signed)
   Subjective:    Patient ID: Mary Murillo, female    DOB: July 03, 1951, 66 y.o.   MRN: [id number]  HPI this patient presents the office stating that she has yellow discolored great toenail on the inside of the big toe. He says that it has been discolored for over a year. She says she has been previously diagnosed with fungus toenail and has been treated with Lamisil by mouth and Diflucan. She has been applying topical medicine for the last year and the outside big toenail has improved but not the inside. She presents requesting laser treatment of this discolored nail.     Review of Systems  All other systems reviewed and are negative.      Objective:   Physical Exam GENERAL APPEARANCE: Alert, conversant. Appropriately groomed. No acute distress.  VASCULAR: Pedal pulses are  palpable at  [facility] and PT bilateral.  Capillary refill time is immediate to all digits,  Normal temperature gradient.  Digital hair growth is present bilateral  NEUROLOGIC: sensation is normal to 5.07 monofilament at 5/5 sites bilateral.  Light touch is intact bilateral, Muscle strength normal.  MUSCULOSKELETAL: acceptable muscle strength, tone and stability bilateral.  DJD 1st MPJ right foot.  Dorsomedial exostosis noted.    DERMATOLOGIC: skin color, texture, and turgor are within normal limits.  No preulcerative lesions or ulcers  are seen, no interdigital maceration noted.  No open lesions present.  Digital nails are asymptomatic.  There is yellow discoloration medial border right great toenail.  No drainage noted.          Assessment & Plan:  Onychomycosis  Right hallux  IE  Debridement of right hallux.  Nail to be sent to lab to determine if fungus.  To be called once the results are returned.   Gardiner Barefoot DPM

## 2017-02-18 NOTE — Addendum Note (Signed)
Addended byDeidre Ala, Jurney Overacker L on: 02/18/2017 05:25 PM   Modules accepted: Orders

## 2017-02-19 ENCOUNTER — Telehealth: Payer: Self-pay | Admitting: Internal Medicine

## 2017-02-19 MED ORDER — ZOLPIDEM TARTRATE 10 MG PO TABS: 10.0000 mg | ORAL_TABLET | Freq: Every evening | ORAL | 5 refills | Status: DC | PRN

## 2017-02-19 NOTE — Telephone Encounter (Signed)
Patient was on Ambien 10mg  back in 2017 (generic).  Her father had a stroke a few months back and 2 days later she found out her Mother needed 24 hour care.  She is scheduled for CPE in August with you.  She wants to know if she can get a refill on this please.  She's having trouble staying asleep for longer than a couple of hours at a time at night.    [facility] [place] [phone][facility]  Best # to reach the patient at:  [phone]

## 2017-02-19 NOTE — Telephone Encounter (Signed)
Called in to pt's pharmacy

## 2017-02-19 NOTE — Telephone Encounter (Signed)
Refill Ambien x 6 months

## 2017-02-27 ENCOUNTER — Telehealth: Payer: Self-pay | Admitting: *Deleted

## 2017-02-27 NOTE — Telephone Encounter (Addendum)
Dr. Prudence Davidson states fungal culture of 03/20/2017 states +for fungus, please have pt make an appt to discuss treatment.

## 2017-03-03 ENCOUNTER — Encounter: Payer: Self-pay | Admitting: Podiatry

## 2017-03-03 ENCOUNTER — Ambulatory Visit (INDEPENDENT_AMBULATORY_CARE_PROVIDER_SITE_OTHER): Payer: Self-pay | Admitting: Podiatry

## 2017-03-03 DIAGNOSIS — M79676 Pain in unspecified toe(s): Secondary | ICD-10-CM

## 2017-03-03 DIAGNOSIS — B351 Tinea unguium: Secondary | ICD-10-CM

## 2017-03-03 MED ORDER — NONFORMULARY OR COMPOUNDED ITEM: 1.0000 g | Freq: Every day | 11 refills | Status: DC

## 2017-03-03 NOTE — Progress Notes (Signed)
   Subjective:    Patient ID: Mary Murillo, female    DOB: 1950-12-12, 66 y.o.   MRN: [id number]  HPI this patient presents the office stating that she has yellow discolored great toenail on the inside of the big toe. He says that it has been discolored for over a year. A sample of her nail was sent to the lab and it revealed a fungal infection to her nail. She is interested in having laser for the correction of this nail. She presents the office for discussion of the results of the lab work and to schedule further treatment    Review of Systems  All other systems reviewed and are negative.      Objective:   Physical Exam GENERAL APPEARANCE: Alert, conversant. Appropriately groomed. No acute distress.  VASCULAR: Pedal pulses are  palpable at  [facility] and PT bilateral.  Capillary refill time is immediate to all digits,  Normal temperature gradient.  Digital hair growth is present bilateral  NEUROLOGIC: sensation is normal to 5.07 monofilament at 5/5 sites bilateral.  Light touch is intact bilateral, Muscle strength normal.  MUSCULOSKELETAL: acceptable muscle strength, tone and stability bilateral.  DJD 1st MPJ right foot.  Dorsomedial exostosis noted.    DERMATOLOGIC: skin color, texture, and turgor are within normal limits.  No preulcerative lesions or ulcers  are seen, no interdigital maceration noted.  No open lesions present.  Digital nails are asymptomatic.  There is yellow discoloration medial border right great toenail.  No drainage noted.          Assessment & Plan:  Onychomycosis  Right hallux  ROV  discussed the results with the patient and it was decided we are going to treat her with laser. Janett Billow came in and discussed treatment and she is to schedule an appointment with Janett Billow.  She is also to be given a medication to apply to her right great toe during the treatment   Gardiner Barefoot DPM   Gardiner Barefoot DPM

## 2017-03-06 ENCOUNTER — Ambulatory Visit (INDEPENDENT_AMBULATORY_CARE_PROVIDER_SITE_OTHER): Payer: Self-pay | Admitting: Podiatry

## 2017-03-06 DIAGNOSIS — M79676 Pain in unspecified toe(s): Secondary | ICD-10-CM

## 2017-03-06 DIAGNOSIS — B351 Tinea unguium: Secondary | ICD-10-CM

## 2017-03-06 NOTE — Progress Notes (Signed)
Pt presents with mycotic infection of nail Rt hallux   All other systems are negative  Laser therapy administered to affected nails and tolerated well. All safety precautions were in place. Re-appointed in 4 weeks for 2nd of 3 treatments

## 2017-03-09 ENCOUNTER — Ambulatory Visit: Payer: Medicare Other

## 2017-04-06 ENCOUNTER — Ambulatory Visit (INDEPENDENT_AMBULATORY_CARE_PROVIDER_SITE_OTHER): Payer: Medicare Other | Admitting: Podiatry

## 2017-04-06 DIAGNOSIS — B351 Tinea unguium: Secondary | ICD-10-CM

## 2017-04-06 DIAGNOSIS — M79676 Pain in unspecified toe(s): Secondary | ICD-10-CM

## 2017-04-21 NOTE — Progress Notes (Signed)
Pt presents with mycotic infection of nail Rt hallux   All other systems are negative  Laser therapy administered to affected nails and tolerated well. All safety precautions were in place. Re-appointed in 4 weeks for 3 of 3 treatments

## 2017-05-04 ENCOUNTER — Ambulatory Visit: Payer: Medicare Other

## 2017-05-04 DIAGNOSIS — M79676 Pain in unspecified toe(s): Principal | ICD-10-CM

## 2017-05-04 DIAGNOSIS — B351 Tinea unguium: Secondary | ICD-10-CM

## 2017-05-20 NOTE — Progress Notes (Signed)
Pt presents with mycotic infection of nail Rt hallux   All other systems are negative  Laser therapy administered to affected nails and tolerated well. All safety precautions were in place. Re-appointed in 4 weeks for 3 of 3 treatments

## 2017-06-24 ENCOUNTER — Other Ambulatory Visit: Payer: Self-pay | Admitting: Internal Medicine

## 2017-06-25 ENCOUNTER — Other Ambulatory Visit: Payer: Medicare Other | Admitting: Internal Medicine

## 2017-06-25 DIAGNOSIS — E785 Hyperlipidemia, unspecified: Secondary | ICD-10-CM

## 2017-06-25 DIAGNOSIS — Z0000 Encounter for general adult medical examination without abnormal findings: Secondary | ICD-10-CM

## 2017-06-25 DIAGNOSIS — F32A Depression, unspecified: Secondary | ICD-10-CM

## 2017-06-25 DIAGNOSIS — E039 Hypothyroidism, unspecified: Secondary | ICD-10-CM

## 2017-06-25 DIAGNOSIS — F329 Major depressive disorder, single episode, unspecified: Secondary | ICD-10-CM

## 2017-06-25 DIAGNOSIS — E559 Vitamin D deficiency, unspecified: Secondary | ICD-10-CM

## 2017-06-25 DIAGNOSIS — J309 Allergic rhinitis, unspecified: Secondary | ICD-10-CM

## 2017-06-25 LAB — CBC WITH DIFFERENTIAL/PLATELET
BASOS ABS: 0 {cells}/uL (ref 0–200)
BASOS PCT: 0 %
EOS PCT: 1 %
Eosinophils Absolute: 70 cells/uL (ref 15–500)
HCT: 45.5 % — ABNORMAL HIGH (ref 35.0–45.0)
HEMOGLOBIN: 15.2 g/dL (ref 11.7–15.5)
Lymphocytes Relative: 37 %
Lymphs Abs: 2590 cells/uL (ref 850–3900)
MCH: 30.9 pg (ref 27.0–33.0)
MCHC: 33.4 g/dL (ref 32.0–36.0)
MCV: 92.5 fL (ref 80.0–100.0)
MONOS PCT: 10 %
MPV: 9.6 fL (ref 7.5–12.5)
Monocytes Absolute: 700 cells/uL (ref 200–950)
Neutro Abs: 3640 cells/uL (ref 1500–7800)
Neutrophils Relative %: 52 %
PLATELETS: 341 10*3/uL (ref 140–400)
RBC: 4.92 MIL/uL (ref 3.80–5.10)
RDW: 14 % (ref 11.0–15.0)
WBC: 7 10*3/uL (ref 3.8–10.8)

## 2017-06-25 LAB — LIPID PANEL
CHOLESTEROL: 213 mg/dL — AB (ref <200)
HDL: 65 mg/dL (ref >50)
LDL CALC: 133 mg/dL — AB (ref <100)
TRIGLYCERIDES: 74 mg/dL (ref <150)
Total CHOL/HDL Ratio: 3.3 Ratio (ref <5.0)
VLDL: 15 mg/dL (ref <30)

## 2017-06-25 LAB — COMPLETE METABOLIC PANEL WITH GFR
ALT: 20 U/L (ref 6–29)
AST: 19 U/L (ref 10–35)
Albumin: 4.1 g/dL (ref 3.6–5.1)
Alkaline Phosphatase: 94 U/L (ref 33–130)
BILIRUBIN TOTAL: 0.8 mg/dL (ref 0.2–1.2)
BUN: 12 mg/dL (ref 7–25)
CALCIUM: 9.7 mg/dL (ref 8.6–10.4)
CO2: 24 mmol/L (ref 20–32)
CREATININE: 0.81 mg/dL (ref 0.50–0.99)
Chloride: 109 mmol/L (ref 98–110)
GFR, EST AFRICAN AMERICAN: 87 mL/min (ref >=60)
GFR, Est Non African American: 75 mL/min (ref >=60)
Glucose, Bld: 89 mg/dL (ref 65–99)
Potassium: 4.5 mmol/L (ref 3.5–5.3)
Sodium: 142 mmol/L (ref 135–146)
TOTAL PROTEIN: 6.6 g/dL (ref 6.1–8.1)

## 2017-06-26 LAB — TSH: TSH: 3.66 mIU/L

## 2017-06-26 LAB — VITAMIN D 25 HYDROXY (VIT D DEFICIENCY, FRACTURES): VIT D 25 HYDROXY: 37 ng/mL (ref 30–100)

## 2017-06-29 ENCOUNTER — Ambulatory Visit (INDEPENDENT_AMBULATORY_CARE_PROVIDER_SITE_OTHER): Payer: Medicare Other | Admitting: Internal Medicine

## 2017-06-29 ENCOUNTER — Encounter: Payer: Self-pay | Admitting: Internal Medicine

## 2017-06-29 VITALS — BP 116/70 | HR 63 | Temp 98.3°F | Ht 63.0 in | Wt 177.0 lb

## 2017-06-29 DIAGNOSIS — M791 Myalgia, unspecified site: Secondary | ICD-10-CM

## 2017-06-29 DIAGNOSIS — W57XXXA Bitten or stung by nonvenomous insect and other nonvenomous arthropods, initial encounter: Secondary | ICD-10-CM | POA: Diagnosis not present

## 2017-06-29 DIAGNOSIS — Z23 Encounter for immunization: Secondary | ICD-10-CM

## 2017-06-29 DIAGNOSIS — E039 Hypothyroidism, unspecified: Secondary | ICD-10-CM | POA: Diagnosis not present

## 2017-06-29 DIAGNOSIS — Z8659 Personal history of other mental and behavioral disorders: Secondary | ICD-10-CM | POA: Diagnosis not present

## 2017-06-29 DIAGNOSIS — Z0000 Encounter for general adult medical examination without abnormal findings: Secondary | ICD-10-CM | POA: Diagnosis not present

## 2017-06-29 DIAGNOSIS — E7800 Pure hypercholesterolemia, unspecified: Secondary | ICD-10-CM

## 2017-06-29 LAB — POCT URINALYSIS DIPSTICK
Bilirubin, UA: NEGATIVE
Blood, UA: NEGATIVE
Glucose, UA: NEGATIVE
KETONES UA: NEGATIVE
LEUKOCYTES UA: NEGATIVE
NITRITE UA: NEGATIVE
PH UA: 6 (ref 5.0–8.0)
PROTEIN UA: NEGATIVE
Spec Grav, UA: 1.015 (ref 1.010–1.025)
UROBILINOGEN UA: 0.2 U/dL

## 2017-06-29 NOTE — Progress Notes (Signed)
Subjective:    Patient ID: Mary Murillo, female    DOB: [birth year], 66 y.o.   MRN: [id number]  HPI Had tick bite perhaps 2 different areas a couple of weeks ago-left leg. She of course is concerned about Lyme disease. No symptoms of RMSF.  Patient used to take Crestor for hyperlipidemia but discontinued it. She has a history of hypothyroidism, hyperlipidemia, depression, asthma, allergic rhinitis, recurrent urinary infections, history of vitamin D deficiency. History of Candida infections due to antibiotic therapy.  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 History of thrombosed hemorrhoid 2004. History of tonsillectomy and adenoidectomy 1956. C-section 1982. Oral mass removed under her tongue 2003. Hysterectomy without oophorectomy 1991.  Colonoscopy done by Dr. Olevia Perches July 2011 with 10 year follow-up recommended.  Zostavax vaccine May 2012. Pneumovax 23 given in 2002. Prevnar given October 2016. Tetanus immunization July 2014.  Family history: Parents in good health. One sister died at age 10 of metastatic breast cancer.  Social history: Married with 2 adult daughters. Does not smoke. Social alcohol consumption. Husband with history of colon cancer. 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 Husband has been a Pharmacist, hospital at [employer].      Review of Systems  Constitutional: Negative.   All other systems reviewed and are negative.      Objective:   Physical Exam  Constitutional: She is oriented to person, place, and time. She appears well-developed and well-nourished.  HENT:  Head: Normocephalic and atraumatic.  Right Ear: External ear normal.  Left Ear: External ear normal.  Mouth/Throat: Oropharynx is clear and moist. No oropharyngeal exudate.  Eyes: Pupils are equal, round, and reactive to light. Conjunctivae  and EOM are normal. Right eye exhibits no discharge. Left eye exhibits no discharge. No scleral icterus.  Neck: Neck supple. No JVD present. No thyromegaly present.  Cardiovascular: Normal rate, regular rhythm and normal heart sounds.   No murmur heard. Pulmonary/Chest: Effort normal and breath sounds normal. No respiratory distress. She has no wheezes. She has no rales. She exhibits no tenderness.  Breasts normal female  Abdominal: Soft. Bowel sounds are normal. She exhibits no distension and no mass. There is no tenderness. There is no rebound and no guarding.  Genitourinary:  Genitourinary Comments: Bimanual normal. Pap deferred due to age.  Musculoskeletal: She exhibits no edema.  Lymphadenopathy:    She has no cervical adenopathy.  Neurological: She is alert and oriented to person, place, and time. She has normal reflexes. No cranial nerve deficit. Coordination normal.  Skin: Skin is warm and dry.  Psychiatric: She has a normal mood and affect. Her behavior is normal. Judgment and thought content normal.  Vitals reviewed.         Assessment & Plan:  Insect bites left leg-reassure patient. We will check Lyme titer and RMSF titer because she is concerned about she has those diseases  Hyperlipidemia-off statin medication. A year ago lipid panel was within normal limits. LDL has gone up from 111-133 and total cholesterol has gone up from 186-213. Triglycerides are normal. Continue diet and exercise.  Hypothyroidism-treated with thyroid replacement medication. TSH stable at 3.66 will continue to monitor. She feels well on this dosage.  History of recurrent urinary infections-none recently  History of depression treated with Lexapro  Allergic rhinitis  History of asthma  History of vitamin D deficiency  Plan: Return in one year or as needed. Recommend annual flu vaccine and annual mammogram. Colonoscopy is up-to-date.  Subjective:   Patient presents for Medicare  Annual/Subsequent preventive examination.  Review Past Medical/Family/Social:See above   Risk Factors  Current exercise habits: Is fairly physically active Dietary issues discussed: Low fat low carbohydrate  Cardiac risk factors:Hyperlipidemia  Depression Screen  (Note: if answer to either of the following is "Yes", a more complete depression screening is indicated)   Over the past two weeks, have you felt down, depressed or hopeless? No  Over the past two weeks, have you felt little interest or pleasure in doing things? No Have you lost interest or pleasure in daily life? No Do you often feel hopeless? No Do you cry easily over simple problems? No   Activities of Daily Living  In your present state of health, do you have any difficulty performing the following activities?:   Driving? No  Managing money? No  Feeding yourself? No  Getting from bed to chair? No  Climbing a flight of stairs? No  Preparing food and eating?: No  Bathing or showering? No  Getting dressed: No  Getting to the toilet? No  Using the toilet:No  Moving around from place to place: No  In the past year have you fallen or had a near fall?:No  Are you sexually active? Yes Do you have more than one partner? No   Hearing Difficulties: No  Do you often ask people to speak up or repeat themselves? Sometimes Do you experience ringing or noises in your ears? Yes Do you have difficulty understanding soft or whispered voices? Yes Do you feel that you have a problem with memory? No Do you often misplace items? No    Home Safety:  Do you have a smoke alarm at your residence? Yes Do you have grab bars in the bathroom?No Do you have throw rugs in your house? Yes   Cognitive Testing  Alert? Yes Normal Appearance?Yes  Oriented to person? Yes Place? Yes  Time? Yes  Recall of three objects? Yes  Can perform simple calculations? Yes  Displays appropriate judgment?Yes  Can read the correct time from a watch  face?Yes   List the Names of Other Physician/Practitioners you currently use:  See referral list for the physicians patient is currently seeing.     Review of Systems: See above   Objective:     General appearance: Appears stated age  Head: Normocephalic, without obvious abnormality, atraumatic  Eyes: conj clear, EOMi PEERLA  Ears: normal TM's and external ear canals both ears  Nose: Nares normal. Septum midline. Mucosa normal. No drainage or sinus tenderness.  Throat: lips, mucosa, and tongue normal; teeth and gums normal  Neck: no adenopathy, no carotid bruit, no JVD, supple, symmetrical, trachea midline and thyroid not enlarged, symmetric, no tenderness/mass/nodules  No CVA tenderness.  Lungs: clear to auscultation bilaterally  Breasts: normal appearance, no masses or tenderness Heart: regular rate and rhythm, S1, S2 normal, no murmur, click, rub or gallop  Abdomen: soft, non-tender; bowel sounds normal; no masses, no organomegaly  Musculoskeletal: ROM normal in all joints, no crepitus, no deformity, Normal muscle strengthen. Back  is symmetric, no curvature. Skin: Skin color, texture, turgor normal. No rashes or lesions  Lymph nodes: Cervical, supraclavicular, and axillary nodes normal.  Neurologic: CN 2 -12 Normal, Normal symmetric reflexes. Normal coordination and gait  Psych: Alert & Oriented x 3, Mood appear stable.  Assessment:    Annual wellness medicare exam   Plan:    During the course of the visit the patient was educated and counseled about appropriate screening and preventive services including:   Annual mammogram  Annual flu vaccine     Patient Instructions (the written plan) was given to the patient.  Medicare Attestation  I have personally reviewed:  The patient's medical and social history  Their use of alcohol, tobacco or illicit drugs  Their current medications and supplements  The patient's functional ability including ADLs,fall risks, home  safety risks, cognitive, and hearing and visual impairment  Diet and physical activities  Evidence for depression or mood disorders  The patient's weight, height, BMI, and visual acuity have been recorded in the chart. I have made referrals, counseling, and provided education to the patient based on review of the above and I have provided the patient with a written personalized care plan for preventive services.

## 2017-07-01 LAB — ROCKY MTN SPOTTED FVR ABS PNL(IGG+IGM)
RMSF IgG: NOT DETECTED
RMSF IgM: NOT DETECTED

## 2017-07-01 NOTE — Patient Instructions (Signed)
It was a pleasure to see you today. I doubt you have any active tickborne illness but titers have been drawn. Flu vaccine given today. Continue same medications and return in one year or as needed. Watch diet as cholesterol has gotten higher over the past year.

## 2017-07-02 LAB — LYME ABY, WSTRN BLT IGG & IGM W/BANDS
B BURGDORFERI IGM ABS (IB): NEGATIVE
B burgdorferi IgG Abs (IB): NEGATIVE
LYME DISEASE 30 KD IGG: REACTIVE — AB
LYME DISEASE 39 KD IGM: NONREACTIVE
LYME DISEASE 41 KD IGG: NONREACTIVE
LYME DISEASE 41 KD IGM: NONREACTIVE
LYME DISEASE 45 KD IGG: NONREACTIVE
LYME DISEASE 58 KD IGG: NONREACTIVE
LYME DISEASE 66 KD IGG: NONREACTIVE
LYME DISEASE 93 KD IGG: NONREACTIVE
Lyme Disease 18 kD IgG: NONREACTIVE
Lyme Disease 23 kD IgG: NONREACTIVE
Lyme Disease 23 kD IgM: REACTIVE — AB
Lyme Disease 28 kD IgG: NONREACTIVE
Lyme Disease 39 kD IgG: NONREACTIVE

## 2017-08-25 ENCOUNTER — Other Ambulatory Visit: Payer: Self-pay | Admitting: Internal Medicine

## 2017-08-25 DIAGNOSIS — Z1231 Encounter for screening mammogram for malignant neoplasm of breast: Secondary | ICD-10-CM

## 2017-08-27 ENCOUNTER — Ambulatory Visit (INDEPENDENT_AMBULATORY_CARE_PROVIDER_SITE_OTHER): Payer: Medicare Other

## 2017-08-27 DIAGNOSIS — Z1231 Encounter for screening mammogram for malignant neoplasm of breast: Secondary | ICD-10-CM

## 2017-09-20 ENCOUNTER — Other Ambulatory Visit: Payer: Self-pay | Admitting: Internal Medicine

## 2018-03-15 ENCOUNTER — Other Ambulatory Visit: Payer: Self-pay | Admitting: Internal Medicine

## 2018-04-28 ENCOUNTER — Other Ambulatory Visit: Payer: Self-pay | Admitting: Internal Medicine

## 2018-04-28 NOTE — Telephone Encounter (Signed)
CPE due after August 28. Please call before refilling to schedule CPE.

## 2018-06-11 ENCOUNTER — Other Ambulatory Visit: Payer: Self-pay | Admitting: Internal Medicine

## 2018-06-15 ENCOUNTER — Other Ambulatory Visit: Payer: Self-pay | Admitting: Internal Medicine

## 2018-07-07 ENCOUNTER — Other Ambulatory Visit: Payer: Self-pay | Admitting: Internal Medicine

## 2018-07-07 DIAGNOSIS — E785 Hyperlipidemia, unspecified: Secondary | ICD-10-CM

## 2018-07-07 DIAGNOSIS — Z8709 Personal history of other diseases of the respiratory system: Secondary | ICD-10-CM

## 2018-07-07 DIAGNOSIS — E7800 Pure hypercholesterolemia, unspecified: Secondary | ICD-10-CM

## 2018-07-07 DIAGNOSIS — Z0000 Encounter for general adult medical examination without abnormal findings: Secondary | ICD-10-CM

## 2018-07-07 DIAGNOSIS — E039 Hypothyroidism, unspecified: Secondary | ICD-10-CM

## 2018-07-07 DIAGNOSIS — Z8659 Personal history of other mental and behavioral disorders: Secondary | ICD-10-CM

## 2018-07-12 ENCOUNTER — Other Ambulatory Visit: Payer: Medicare Other | Admitting: Internal Medicine

## 2018-07-12 DIAGNOSIS — Z0000 Encounter for general adult medical examination without abnormal findings: Secondary | ICD-10-CM

## 2018-07-12 DIAGNOSIS — E7800 Pure hypercholesterolemia, unspecified: Secondary | ICD-10-CM

## 2018-07-12 DIAGNOSIS — E785 Hyperlipidemia, unspecified: Secondary | ICD-10-CM

## 2018-07-12 DIAGNOSIS — Z8659 Personal history of other mental and behavioral disorders: Secondary | ICD-10-CM

## 2018-07-12 DIAGNOSIS — E039 Hypothyroidism, unspecified: Secondary | ICD-10-CM

## 2018-07-12 DIAGNOSIS — Z8709 Personal history of other diseases of the respiratory system: Secondary | ICD-10-CM

## 2018-07-12 LAB — LIPID PANEL
Cholesterol: 211 mg/dL — ABNORMAL HIGH (ref <200)
HDL: 53 mg/dL (ref >50)
LDL Cholesterol (Calc): 136 mg/dL (calc) — ABNORMAL HIGH
NON-HDL CHOLESTEROL (CALC): 158 mg/dL — AB (ref <130)
Total CHOL/HDL Ratio: 4 (calc) (ref <5.0)
Triglycerides: 111 mg/dL (ref <150)

## 2018-07-12 LAB — CBC WITH DIFFERENTIAL/PLATELET
BASOS PCT: 0.4 %
Basophils Absolute: 21 cells/uL (ref 0–200)
Eosinophils Absolute: 42 cells/uL (ref 15–500)
Eosinophils Relative: 0.8 %
HEMATOCRIT: 44 % (ref 35.0–45.0)
HEMOGLOBIN: 14.6 g/dL (ref 11.7–15.5)
Lymphs Abs: 1781 cells/uL (ref 850–3900)
MCH: 30.3 pg (ref 27.0–33.0)
MCHC: 33.2 g/dL (ref 32.0–36.0)
MCV: 91.3 fL (ref 80.0–100.0)
MPV: 10.5 fL (ref 7.5–12.5)
Monocytes Relative: 10.1 %
NEUTROS ABS: 2920 {cells}/uL (ref 1500–7800)
Neutrophils Relative %: 55.1 %
PLATELETS: 329 10*3/uL (ref 140–400)
RBC: 4.82 10*6/uL (ref 3.80–5.10)
RDW: 12.8 % (ref 11.0–15.0)
Total Lymphocyte: 33.6 %
WBC mixed population: 535 cells/uL (ref 200–950)
WBC: 5.3 10*3/uL (ref 3.8–10.8)

## 2018-07-12 LAB — COMPLETE METABOLIC PANEL WITH GFR
AG Ratio: 1.6 (calc) (ref 1.0–2.5)
ALT: 21 U/L (ref 6–29)
AST: 23 U/L (ref 10–35)
Albumin: 4.1 g/dL (ref 3.6–5.1)
Alkaline phosphatase (APISO): 95 U/L (ref 33–130)
BILIRUBIN TOTAL: 1.2 mg/dL (ref 0.2–1.2)
BUN/Creatinine Ratio: 7 (calc) (ref 6–22)
BUN: 8 mg/dL (ref 7–25)
CALCIUM: 10.1 mg/dL (ref 8.6–10.4)
CHLORIDE: 105 mmol/L (ref 98–110)
CO2: 26 mmol/L (ref 20–32)
CREATININE: 1.1 mg/dL — AB (ref 0.50–0.99)
GFR, EST AFRICAN AMERICAN: 60 mL/min/{1.73_m2} (ref >=60)
GFR, EST NON AFRICAN AMERICAN: 52 mL/min/{1.73_m2} — AB (ref >=60)
GLUCOSE: 96 mg/dL (ref 65–99)
Globulin: 2.6 g/dL (calc) (ref 1.9–3.7)
Potassium: 4.8 mmol/L (ref 3.5–5.3)
Sodium: 141 mmol/L (ref 135–146)
TOTAL PROTEIN: 6.7 g/dL (ref 6.1–8.1)

## 2018-07-12 LAB — TSH: TSH: 3.18 mIU/L (ref 0.40–4.50)

## 2018-07-13 ENCOUNTER — Ambulatory Visit (INDEPENDENT_AMBULATORY_CARE_PROVIDER_SITE_OTHER): Payer: Medicare Other | Admitting: Internal Medicine

## 2018-07-13 ENCOUNTER — Encounter: Payer: Self-pay | Admitting: Internal Medicine

## 2018-07-13 VITALS — BP 110/80 | HR 74 | Ht 62.25 in | Wt 188.0 lb

## 2018-07-13 DIAGNOSIS — E7800 Pure hypercholesterolemia, unspecified: Secondary | ICD-10-CM | POA: Diagnosis not present

## 2018-07-13 DIAGNOSIS — R5383 Other fatigue: Secondary | ICD-10-CM | POA: Diagnosis not present

## 2018-07-13 DIAGNOSIS — Z0000 Encounter for general adult medical examination without abnormal findings: Secondary | ICD-10-CM

## 2018-07-13 DIAGNOSIS — E039 Hypothyroidism, unspecified: Secondary | ICD-10-CM | POA: Diagnosis not present

## 2018-07-13 DIAGNOSIS — Z23 Encounter for immunization: Secondary | ICD-10-CM

## 2018-07-13 DIAGNOSIS — Z8709 Personal history of other diseases of the respiratory system: Secondary | ICD-10-CM | POA: Diagnosis not present

## 2018-07-13 DIAGNOSIS — R7989 Other specified abnormal findings of blood chemistry: Secondary | ICD-10-CM

## 2018-07-13 DIAGNOSIS — Z8659 Personal history of other mental and behavioral disorders: Secondary | ICD-10-CM | POA: Diagnosis not present

## 2018-07-13 DIAGNOSIS — W57XXXA Bitten or stung by nonvenomous insect and other nonvenomous arthropods, initial encounter: Secondary | ICD-10-CM

## 2018-07-13 LAB — POCT URINALYSIS DIPSTICK
APPEARANCE: NORMAL
BILIRUBIN UA: NEGATIVE
Glucose, UA: NEGATIVE
KETONES UA: NEGATIVE
Leukocytes, UA: NEGATIVE
NITRITE UA: NEGATIVE
ODOR: NORMAL
PH UA: 6 (ref 5.0–8.0)
PROTEIN UA: NEGATIVE
RBC UA: NEGATIVE
Spec Grav, UA: 1.01 (ref 1.010–1.025)
UROBILINOGEN UA: 0.2 U/dL

## 2018-07-13 NOTE — Progress Notes (Signed)
Subjective:    Patient ID: Mary Murillo, female    DOB: May 15, 1951, 67 y.o.   MRN: [id number]  HPI 67 year old Female in today for health maintenance exam, and evaluation of medical issues.  Lost parents this past year. Parents had dementia and father had history of strokes.  Patient says daughter has history of Lyme disease.  Patient has had some recent tick bites and wants to be tested for Lyme disease.  We will also test for [facility] spotted fever.  Was tested for both and August 2018 after tick bite.  Patient is to take Crestor for hyperlipidemia but discontinued it.  She had a history of hypothyroidism, hyperlipidemia, depression, asthma, allergic rhinitis, recurrent urinary infections, history of vitamin D deficiency.  History of Candida infections related to being on antibiotic therapy.  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  History of thrombosed hemorrhoid 2004.  History of tonsillectomy and adenoidectomy 1956.  C-section 1982.  Oral mass removed on her tongue 2003.  Hysterectomy without oophorectomy 1991.  Colonoscopy done by Dr. Maurene Capes July 2011 with 10-year follow-up recommended.  Family history: See above.  One sister died of metastatic breast cancer.  Social history: Married with 2 adult daughters.  Does not smoke.  Social alcohol consumption.  Husband with history of colon cancer.  He poorly tolerant grams per day school.  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    Review of Systems no rashes fever or chills with recent tick bites     Objective:   Physical Exam  Constitutional: She is oriented to person, place, and time. She appears well-developed and well-nourished. No distress.  HENT:  Head: Normocephalic and atraumatic.  Right Ear: External ear normal.  Left Ear: External ear normal.  Nose: Nose normal.  Mouth/Throat: Oropharynx is clear and  moist. No oropharyngeal exudate.  Eyes: Pupils are equal, round, and reactive to light. Conjunctivae and EOM are normal. Right eye exhibits no discharge. Left eye exhibits no discharge. No scleral icterus.  Neck: No JVD present. No thyromegaly present.  Cardiovascular: Normal rate, regular rhythm and normal heart sounds.  No murmur heard. Pulmonary/Chest: Effort normal and breath sounds normal. No stridor. No respiratory distress. She has no wheezes. She has no rales.  Breasts normal female without masses  Abdominal: Soft. Bowel sounds are normal. She exhibits no distension and no mass. There is no tenderness. There is no rebound and no guarding.  Genitourinary:  Genitourinary Comments: Bimanual normal.  Pap deferred due to age  Musculoskeletal: She exhibits no edema.  Lymphadenopathy:    She has no cervical adenopathy.  Neurological: She is alert and oriented to person, place, and time. She displays normal reflexes. No cranial nerve deficit. She exhibits normal muscle tone. Coordination normal.  Skin: Skin is warm and dry. No rash noted. She is not diaphoretic.  Psychiatric: She has a normal mood and affect. Her behavior is normal. Judgment and thought content normal.  Vitals reviewed.         Assessment & Plan:  Self-reported history of tick bites and wants to be checked for tickborne illness.  Titers drawn  Hyperlipidemia-no longer takes statin medication.  Total cholesterol was 211 with an LDL cholesterol of 136.  HDL is 53 and triglycerides are 111.  Hypothyroidism-TSH stable at 3.18 on thyroid replacement  History of recurrent urinary infections-none recently  History of depression treated  with Lexapro.  Allergic rhinitis  History of insomnia-previously treated with Ambien  History of asthma  History of vitamin D deficiency-last year vitamin D level was normal at 37 and was not checked this year.  Medicare does not pay for it.  Fatigue-etiology unclear.  Titers drawn  for tickborne illness.  Elevated serum creatinine of 1.10.  This was drawn fasting.  Repeated today and is 0.90.  Health maintenance: Shingrix vaccine prescription given.  Flu vaccine and pneumococcal 23 vaccine given  Plan: She will return in 3 months for office visit and fasting lipid panel.  Lyme titers are pending.  Subjective:   Patient presents for Medicare Annual/Subsequent preventive examination.  Review Past Medical/Family/Social: See above   Risk Factors  Current exercise habits: Physically active Dietary issues discussed: Low-fat low carbohydrate  Cardiac risk factors: Hyperlipidemia, family history of stroke in father  Depression Screen  (Note: if answer to either of the following is "Yes", a more complete depression screening is indicated)   Over the past two weeks, have you felt down, depressed or hopeless? No  Over the past two weeks, have you felt little interest or pleasure in doing things? No Have you lost interest or pleasure in daily life? No Do you often feel hopeless? No Do you cry easily over simple problems? No   Activities of Daily Living  In your present state of health, do you have any difficulty performing the following activities?:   Driving? No  Managing money? No  Feeding yourself? No  Getting from bed to chair? No  Climbing a flight of stairs? No  Preparing food and eating?: No  Bathing or showering? No  Getting dressed: No  Getting to the toilet? No  Using the toilet:No  Moving around from place to place: No  In the past year have you fallen or had a near fall?:No  Are you sexually active?  Yes Do you have more than one partner? No   Hearing Difficulties: No  Do you often ask people to speak up or repeat themselves? No  Do you experience ringing or noises in your ears? No  Do you have difficulty understanding soft or whispered voices? No  Do you feel that you have a problem with memory? No Do you often misplace items? No    Home  Safety:  Do you have a smoke alarm at your residence? Yes Do you have grab bars in the bathroom?  No Do you have throw rugs in your house?  Yes   Cognitive Testing  Alert? Yes Normal Appearance?Yes  Oriented to person? Yes Place? Yes  Time? Yes  Recall of three objects? Yes  Can perform simple calculations? Yes  Displays appropriate judgment?Yes  Can read the correct time from a watch face?Yes   List the Names of Other Physician/Practitioners you currently use:  See referral list for the physicians patient is currently seeing.     Review of Systems: See above   Objective:     General appearance: Appears stated age  Head: Normocephalic, without obvious abnormality, atraumatic  Eyes: conj clear, EOMi PEERLA  Ears: normal TM's and external ear canals both ears  Nose: Nares normal. Septum midline. Mucosa normal. No drainage or sinus tenderness.  Throat: lips, mucosa, and tongue normal; teeth and gums normal  Neck: no adenopathy, no carotid bruit, no JVD, supple, symmetrical, trachea midline and thyroid not enlarged, symmetric, no tenderness/mass/nodules  No CVA tenderness.  Lungs: clear to auscultation bilaterally  Breasts: normal appearance, no  masses or tenderness Heart: regular rate and rhythm, S1, S2 normal, no murmur, click, rub or gallop  Abdomen: soft, non-tender; bowel sounds normal; no masses, no organomegaly  Musculoskeletal: ROM normal in all joints, no crepitus, no deformity, Normal muscle strengthen. Back  is symmetric, no curvature. Skin: Skin color, texture, turgor normal. No rashes or lesions  Lymph nodes: Cervical, supraclavicular, and axillary nodes normal.  Neurologic: CN 2 -12 Normal, Normal symmetric reflexes. Normal coordination and gait  Psych: Alert & Oriented x 3, Mood appear stable.    Assessment:    Annual wellness medicare exam   Plan:    During the course of the visit the patient was educated and counseled about appropriate screening and  preventive services including:   Flu vaccine  Pneumococcal vaccine     Patient Instructions (the written plan) was given to the patient.  Medicare Attestation  I have personally reviewed:  The patient's medical and social history  Their use of alcohol, tobacco or illicit drugs  Their current medications and supplements  The patient's functional ability including ADLs,fall risks, home safety risks, cognitive, and hearing and visual impairment  Diet and physical activities  Evidence for depression or mood disorders  The patient's weight, height, BMI, and visual acuity have been recorded in the chart. I have made referrals, counseling, and provided education to the patient based on review of the above and I have provided the patient with a written personalized care plan for preventive services.

## 2018-07-13 NOTE — Patient Instructions (Addendum)
Shingrix vaccine Rx given. RTC 3 months for lipid panel and OV. Lyme titer and Creatinine repeated. Flu vaccine and pneumo 23 given

## 2018-07-14 ENCOUNTER — Other Ambulatory Visit: Payer: Self-pay | Admitting: Internal Medicine

## 2018-07-14 DIAGNOSIS — R5383 Other fatigue: Secondary | ICD-10-CM

## 2018-07-16 LAB — CREATININE WITH EST GFR
Creat: 0.9 mg/dL (ref 0.50–0.99)
GFR, EST AFRICAN AMERICAN: 76 mL/min/{1.73_m2} (ref >=60)
GFR, Est Non African American: 66 mL/min/{1.73_m2} (ref >=60)

## 2018-07-16 LAB — TEST AUTHORIZATION

## 2018-07-16 LAB — ROCKY MTN SPOTTED FVR ABS PNL(IGG+IGM)
RMSF IGM: NOT DETECTED
RMSF IgG: NOT DETECTED

## 2018-07-16 LAB — B. BURGDORFI ANTIBODIES: B burgdorferi Ab IgG+IgM: <0.9 index

## 2018-07-19 ENCOUNTER — Other Ambulatory Visit: Payer: Medicare Other | Admitting: Internal Medicine

## 2018-07-19 DIAGNOSIS — R5383 Other fatigue: Secondary | ICD-10-CM

## 2018-07-22 LAB — B. BURGDORFI ANTIBODIES BY WB
B BURGDORFERI IGG ABS (IB): NEGATIVE
B burgdorferi IgM Abs (IB): NEGATIVE
LYME DISEASE 18 KD IGG: REACTIVE — AB
LYME DISEASE 23 KD IGG: REACTIVE — AB
LYME DISEASE 28 KD IGG: NONREACTIVE
LYME DISEASE 39 KD IGG: NONREACTIVE
LYME DISEASE 41 KD IGG: NONREACTIVE
LYME DISEASE 66 KD IGG: NONREACTIVE
Lyme Disease 23 kD IgM: NONREACTIVE
Lyme Disease 30 kD IgG: NONREACTIVE
Lyme Disease 39 kD IgM: NONREACTIVE
Lyme Disease 41 kD IgM: NONREACTIVE
Lyme Disease 45 kD IgG: NONREACTIVE
Lyme Disease 58 kD IgG: NONREACTIVE
Lyme Disease 93 kD IgG: REACTIVE — AB

## 2018-07-28 ENCOUNTER — Other Ambulatory Visit: Payer: Self-pay | Admitting: Internal Medicine

## 2018-10-25 ENCOUNTER — Other Ambulatory Visit: Payer: Self-pay | Admitting: Internal Medicine

## 2018-11-15 ENCOUNTER — Other Ambulatory Visit: Payer: Self-pay | Admitting: Internal Medicine

## 2018-12-07 ENCOUNTER — Other Ambulatory Visit: Payer: Self-pay | Admitting: Internal Medicine

## 2018-12-07 DIAGNOSIS — Z1231 Encounter for screening mammogram for malignant neoplasm of breast: Secondary | ICD-10-CM

## 2018-12-16 ENCOUNTER — Ambulatory Visit (INDEPENDENT_AMBULATORY_CARE_PROVIDER_SITE_OTHER): Payer: Medicare Other

## 2018-12-16 DIAGNOSIS — Z1231 Encounter for screening mammogram for malignant neoplasm of breast: Secondary | ICD-10-CM

## 2019-02-06 ENCOUNTER — Other Ambulatory Visit: Payer: Self-pay | Admitting: Internal Medicine

## 2019-03-04 ENCOUNTER — Telehealth: Payer: Self-pay | Admitting: Internal Medicine

## 2019-03-04 NOTE — Telephone Encounter (Signed)
LVM to CB and schedule CPE and Lab appointment after 07/14/19

## 2019-06-04 ENCOUNTER — Other Ambulatory Visit: Payer: Self-pay | Admitting: Internal Medicine

## 2019-06-06 MED ORDER — ESCITALOPRAM OXALATE 20 MG PO TABS: 20.0000 mg | ORAL_TABLET | Freq: Every day | ORAL | 3 refills | Status: DC

## 2019-06-06 NOTE — Telephone Encounter (Signed)
Left message to return call and to schedule CPE. Then we can refill.

## 2019-06-06 NOTE — Telephone Encounter (Signed)
Due for CPE September. Must book before refilling

## 2019-06-28 NOTE — Telephone Encounter (Signed)
PT CB and scheduled appointments

## 2019-08-15 ENCOUNTER — Other Ambulatory Visit: Payer: Medicare Other | Admitting: Internal Medicine

## 2019-08-15 ENCOUNTER — Other Ambulatory Visit: Payer: Self-pay

## 2019-08-15 DIAGNOSIS — R5383 Other fatigue: Secondary | ICD-10-CM

## 2019-08-15 DIAGNOSIS — A770 Spotted fever due to Rickettsia rickettsii: Secondary | ICD-10-CM

## 2019-08-15 DIAGNOSIS — E039 Hypothyroidism, unspecified: Secondary | ICD-10-CM

## 2019-08-15 DIAGNOSIS — R531 Weakness: Secondary | ICD-10-CM

## 2019-08-15 DIAGNOSIS — Z8709 Personal history of other diseases of the respiratory system: Secondary | ICD-10-CM

## 2019-08-15 DIAGNOSIS — Z0000 Encounter for general adult medical examination without abnormal findings: Secondary | ICD-10-CM

## 2019-08-15 DIAGNOSIS — E7800 Pure hypercholesterolemia, unspecified: Secondary | ICD-10-CM

## 2019-08-17 ENCOUNTER — Encounter: Payer: Self-pay | Admitting: Internal Medicine

## 2019-08-17 ENCOUNTER — Ambulatory Visit (INDEPENDENT_AMBULATORY_CARE_PROVIDER_SITE_OTHER): Payer: Medicare Other | Admitting: Internal Medicine

## 2019-08-17 ENCOUNTER — Other Ambulatory Visit: Payer: Self-pay

## 2019-08-17 VITALS — BP 120/80 | HR 68 | Temp 98.2°F | Ht 62.25 in | Wt 180.0 lb

## 2019-08-17 DIAGNOSIS — Z8659 Personal history of other mental and behavioral disorders: Secondary | ICD-10-CM | POA: Diagnosis not present

## 2019-08-17 DIAGNOSIS — E063 Autoimmune thyroiditis: Secondary | ICD-10-CM

## 2019-08-17 DIAGNOSIS — Z0000 Encounter for general adult medical examination without abnormal findings: Secondary | ICD-10-CM

## 2019-08-17 DIAGNOSIS — E7800 Pure hypercholesterolemia, unspecified: Secondary | ICD-10-CM | POA: Diagnosis not present

## 2019-08-17 LAB — COMPLETE METABOLIC PANEL WITH GFR
AG Ratio: 1.5 (calc) (ref 1.0–2.5)
ALT: 31 U/L — ABNORMAL HIGH (ref 6–29)
AST: 21 U/L (ref 10–35)
Albumin: 4 g/dL (ref 3.6–5.1)
Alkaline phosphatase (APISO): 111 U/L (ref 37–153)
BUN: 10 mg/dL (ref 7–25)
CO2: 24 mmol/L (ref 20–32)
Calcium: 9.5 mg/dL (ref 8.6–10.4)
Chloride: 107 mmol/L (ref 98–110)
Creat: 0.86 mg/dL (ref 0.50–0.99)
GFR, Est African American: 80 mL/min/{1.73_m2} (ref >=60)
GFR, Est Non African American: 69 mL/min/{1.73_m2} (ref >=60)
Globulin: 2.6 g/dL (calc) (ref 1.9–3.7)
Glucose, Bld: 94 mg/dL (ref 65–99)
Potassium: 4.3 mmol/L (ref 3.5–5.3)
Sodium: 142 mmol/L (ref 135–146)
Total Bilirubin: 1.1 mg/dL (ref 0.2–1.2)
Total Protein: 6.6 g/dL (ref 6.1–8.1)

## 2019-08-17 LAB — CBC WITH DIFFERENTIAL/PLATELET
Absolute Monocytes: 551 cells/uL (ref 200–950)
Basophils Absolute: 31 cells/uL (ref 0–200)
Basophils Relative: 0.6 %
Eosinophils Absolute: 51 cells/uL (ref 15–500)
Eosinophils Relative: 1 %
HCT: 44.5 % (ref 35.0–45.0)
Hemoglobin: 14.8 g/dL (ref 11.7–15.5)
Lymphs Abs: 1841 cells/uL (ref 850–3900)
MCH: 31.8 pg (ref 27.0–33.0)
MCHC: 33.3 g/dL (ref 32.0–36.0)
MCV: 95.7 fL (ref 80.0–100.0)
MPV: 10.6 fL (ref 7.5–12.5)
Monocytes Relative: 10.8 %
Neutro Abs: 2627 cells/uL (ref 1500–7800)
Neutrophils Relative %: 51.5 %
Platelets: 346 10*3/uL (ref 140–400)
RBC: 4.65 10*6/uL (ref 3.80–5.10)
RDW: 13.6 % (ref 11.0–15.0)
Total Lymphocyte: 36.1 %
WBC: 5.1 10*3/uL (ref 3.8–10.8)

## 2019-08-17 LAB — B. BURGDORFI ANTIBODIES BY WB
B burgdorferi IgG Abs (IB): NEGATIVE
B burgdorferi IgM Abs (IB): NEGATIVE
Lyme Disease 18 kD IgG: NONREACTIVE
Lyme Disease 23 kD IgG: REACTIVE — AB
Lyme Disease 23 kD IgM: REACTIVE — AB
Lyme Disease 28 kD IgG: NONREACTIVE
Lyme Disease 30 kD IgG: NONREACTIVE
Lyme Disease 39 kD IgG: NONREACTIVE
Lyme Disease 39 kD IgM: NONREACTIVE
Lyme Disease 41 kD IgG: NONREACTIVE
Lyme Disease 41 kD IgM: NONREACTIVE
Lyme Disease 45 kD IgG: NONREACTIVE
Lyme Disease 58 kD IgG: NONREACTIVE
Lyme Disease 66 kD IgG: NONREACTIVE
Lyme Disease 93 kD IgG: REACTIVE — AB

## 2019-08-17 LAB — POCT URINALYSIS DIPSTICK
Appearance: NEGATIVE
Bilirubin, UA: NEGATIVE
Blood, UA: NEGATIVE
Glucose, UA: NEGATIVE
Ketones, UA: NEGATIVE
Leukocytes, UA: NEGATIVE
Nitrite, UA: NEGATIVE
Odor: NEGATIVE
Protein, UA: NEGATIVE
Spec Grav, UA: 1.01 (ref 1.010–1.025)
Urobilinogen, UA: 0.2 E.U./dL
pH, UA: 6.5 (ref 5.0–8.0)

## 2019-08-17 LAB — LIPID PANEL
Cholesterol: 223 mg/dL — ABNORMAL HIGH (ref <200)
HDL: 54 mg/dL (ref >=50)
LDL Cholesterol (Calc): 149 mg/dL (calc) — ABNORMAL HIGH
Non-HDL Cholesterol (Calc): 169 mg/dL (calc) — ABNORMAL HIGH (ref <130)
Total CHOL/HDL Ratio: 4.1 (calc) (ref <5.0)
Triglycerides: 93 mg/dL (ref <150)

## 2019-08-17 LAB — TSH: TSH: 4.1 mIU/L (ref 0.40–4.50)

## 2019-08-17 MED ORDER — ESCITALOPRAM OXALATE 20 MG PO TABS: 20.0000 mg | ORAL_TABLET | Freq: Every day | ORAL | 3 refills | Status: DC

## 2019-08-17 MED ORDER — LEVOTHYROXINE SODIUM 50 MCG PO TABS: ORAL_TABLET | ORAL | 1 refills | Status: DC

## 2019-08-17 MED ORDER — ZOLPIDEM TARTRATE 10 MG PO TABS: 10.0000 mg | ORAL_TABLET | Freq: Every evening | ORAL | 5 refills | Status: DC | PRN

## 2019-08-17 NOTE — Progress Notes (Signed)
Subjective:    Patient ID: Mary Murillo, female    DOB: 03/01/1951, 68 y.o.   MRN: [id number]  HPI Pleasant 68 year old Female in today for health maintenance exam, Medicare wellness and evaluation of medical issues.  Patient requesting Western blot for Lyme disease.  Has had tick bites in the past.  Suspect the test will be negative.  Was tested for Lyme disease August 2018 and 2018/02/14.  She previously took Crestor for hyperlipidemia but discontinued it.  She has a history of hypothyroidism, hyperlipidemia, depression, asthma, allergic rhinitis, recurrent urinary infections, history of vitamin D deficiency.  History of Candida vaginal infections related to being on antibiotic therapy.  In November 1993 she had left fallopian tube removed due to benign mesothelial peritoneal cyst with adhesions  History of thrombosed hemorrhoid Feb 15, 2003.  History of tonsillectomy and adenoidectomy February 15, 1955.  C-section Feb 14, 1981.  Oral mass removed on her time 2002/02/14.  Hysterectomy without oophorectomy 1991.  Colonoscopy done by Dr. Olevia Perches July 2011 with 10-year follow-up recommended.  Family history: 1 sister died of metastatic breast cancer.  Parents died Feb 14, 2018.  Both had dementia and father had history of strokes.  Daughter with history of Lyme disease.  Social history: Married with 2 adult daughters.  Does not smoke.  Social alcohol consumption.  Husband with history of colon cancer.  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  Husband was a Pharmacist, hospital at [employer] but is retired.    Review of Systems  Constitutional: Negative.   All other systems reviewed and are negative.      Objective:   Physical Exam Vitals signs reviewed.  Constitutional:      General: She is not in acute distress.    Appearance: Normal appearance.  HENT:     Head: Normocephalic and atraumatic.     Right Ear: Tympanic membrane and ear canal normal.     Left Ear: Tympanic membrane and ear  canal normal.     Nose: Nose normal.  Eyes:     General: No scleral icterus.    Extraocular Movements: Extraocular movements intact.     Conjunctiva/sclera: Conjunctivae normal.     Pupils: Pupils are equal, round, and reactive to light.  Neck:     Musculoskeletal: Neck supple. No neck rigidity.     Vascular: No carotid bruit.     Comments: No thyromegaly Cardiovascular:     Rate and Rhythm: Normal rate and regular rhythm.     Pulses: Normal pulses.     Heart sounds: Normal heart sounds. No murmur.     Comments: Breast without masses Pulmonary:     Effort: Pulmonary effort is normal. No respiratory distress.     Breath sounds: Normal breath sounds. No wheezing or rales.  Abdominal:     General: Bowel sounds are normal.     Palpations: Abdomen is soft. There is no mass.     Tenderness: There is no abdominal tenderness. There is no rebound.  Genitourinary:    Comments: Bimanual normal.  Pap deferred due to age Musculoskeletal:        General: No swelling.     Right lower leg: No edema.     Left lower leg: No edema.  Skin:    General: Skin is warm and dry.     Findings: No rash.  Neurological:     General: No focal deficit present.     Mental Status: She is alert and oriented  to person, place, and time.     Cranial Nerves: No cranial nerve deficit.     Sensory: No sensory deficit.     Motor: No weakness.     Coordination: Coordination normal.     Gait: Gait normal.     Deep Tendon Reflexes: Reflexes normal.  Psychiatric:        Mood and Affect: Mood normal.        Behavior: Behavior normal.        Thought Content: Thought content normal.        Judgment: Judgment normal.           Assessment & Plan:   Hypothyroidism- increase levothyroxine to 50 mcg per day. She can choose to come back in a few weeks for TSH only  Pure hypercholesterolemia with total cholesterol 223, triglycerides 93 and LDL 149- pt to work on diet and exercise. Does not want to be on statin.   Hx tick bite - 3 tears ago. Wants Western blot done- it is pending  Insomnia- has Ambien on hand for prn use.  Hx of depression- treated with Lexapro  Plan: Patient does not want to be on statin medication.  Continue Lexapro with history of depression and increase levothyroxine as TSH is 4.10 and would like to have it around 1.  Suggest repeating TSH in 6 weeks or so.   Continue to work on diet and exercise.  Subjective:   Patient presents for Medicare Annual/Subsequent preventive examination.  Review Past Medical/Family/Social: See above  Risk Factors  Current exercise habits: Gets plenty of exercise Dietary issues discussed: Low-fat low carbohydrate  Cardiac risk factors: Pure hypercholesterolemia  Depression Screen  (Note: if answer to either of the following is "Yes", a more complete depression screening is indicated)   Over the past two weeks, have you felt down, depressed or hopeless? No  Over the past two weeks, have you felt little interest or pleasure in doing things? No Have you lost interest or pleasure in daily life? No Do you often feel hopeless? No Do you cry easily over simple problems? No   Activities of Daily Living  In your present state of health, do you have any difficulty performing the following activities?:   Driving? No  Managing money? No  Feeding yourself? No  Getting from bed to chair? No  Climbing a flight of stairs? No  Preparing food and eating?: No  Bathing or showering? No  Getting dressed: No  Getting to the toilet? No  Using the toilet:No  Moving around from place to place: No  In the past year have you fallen or had a near fall?:No  Are you sexually active? Yes Do you have more than one partner? No   Hearing Difficulties: No  Do you often ask people to speak up or repeat themselves? No  Do you experience ringing or noises in your ears? No  Do you have difficulty understanding soft or whispered voices? No  Do you feel that you have a  problem with memory? No Do you often misplace items? No    Home Safety:  Do you have a smoke alarm at your residence? Yes Do you have grab bars in the bathroom?  None Do you have throw rugs in your house?  Yes   Cognitive Testing  Alert? Yes Normal Appearance?Yes  Oriented to person? Yes Place? Yes  Time? Yes  Recall of three objects? Yes  Can perform simple calculations? Yes  Displays appropriate judgment?Yes  Can read the correct time from a watch face?Yes   List the Names of Other Physician/Practitioners you currently use:  See referral list for the physicians patient is currently seeing.     Review of Systems: See above   Objective:     General appearance: Appears younger than stated age Head: Normocephalic, without obvious abnormality, atraumatic  Eyes: conj clear, EOMi PEERLA  Ears: normal TM's and external ear canals both ears  Nose: Nares normal. Septum midline. Mucosa normal. No drainage or sinus tenderness.  Throat: lips, mucosa, and tongue normal; teeth and gums normal  Neck: no adenopathy, no carotid bruit, no JVD, supple, symmetrical, trachea midline and thyroid not enlarged, symmetric, no tenderness/mass/nodules  No CVA tenderness.  Lungs: clear to auscultation bilaterally  Breasts: normal appearance, no masses or tenderness Heart: regular rate and rhythm, S1, S2 normal, no murmur, click, rub or gallop  Abdomen: soft, non-tender; bowel sounds normal; no masses, no organomegaly  Musculoskeletal: ROM normal in all joints, no crepitus, no deformity, Normal muscle strengthen. Back  is symmetric, no curvature. Skin: Skin color, texture, turgor normal. No rashes or lesions  Lymph nodes: Cervical, supraclavicular, and axillary nodes normal.  Neurologic: CN 2 -12 Normal, Normal symmetric reflexes. Normal coordination and gait  Psych: Alert & Oriented x 3, Mood appear stable.    Assessment:    Annual wellness medicare exam   Plan:    During the course of  the visit the patient was educated and counseled about appropriate screening and preventive services including:   Vaccines discussed     Patient Instructions (the written plan) was given to the patient.  Medicare Attestation  I have personally reviewed:  The patient's medical and social history  Their use of alcohol, tobacco or illicit drugs  Their current medications and supplements  The patient's functional ability including ADLs,fall risks, home safety risks, cognitive, and hearing and visual impairment  Diet and physical activities  Evidence for depression or mood disorders  The patient's weight, height, BMI, and visual acuity have been recorded in the chart. I have made referrals, counseling, and provided education to the patient based on review of the above and I have provided the patient with a written personalized care plan for preventive services.

## 2019-08-19 ENCOUNTER — Encounter: Payer: Medicare Other | Admitting: Internal Medicine

## 2019-08-22 ENCOUNTER — Telehealth: Payer: Self-pay | Admitting: Internal Medicine

## 2019-08-22 NOTE — Telephone Encounter (Signed)
Mary Murillo [phone]  [facility] called she was trying to go onto mychart to see if her lab results was there. She was locked out, so I got her set back up. She would like to know if her results are back yet.

## 2019-09-03 NOTE — Patient Instructions (Addendum)
Continue to work on diet and exercise.  Patient does not want to be on statin medication.  Dose of thyroid replacement changed and needs to have follow-up TSH in about 6 weeks without office visit.  It was a pleasure to see you today.

## 2019-12-04 ENCOUNTER — Ambulatory Visit: Payer: Medicare Other

## 2019-12-11 ENCOUNTER — Ambulatory Visit: Payer: Medicare PPO | Attending: Internal Medicine

## 2019-12-11 DIAGNOSIS — Z23 Encounter for immunization: Secondary | ICD-10-CM | POA: Insufficient documentation

## 2019-12-11 NOTE — Patient Instructions (Signed)
   Covid-19 Vaccination Clinic  Name:  Mary Murillo    MRN: [id number] DOB: 11-Mar-1951  12/11/2019  [name] was observed post Covid-19 immunization for 15 minutes without incidence. She was provided with Vaccine Information Sheet and instruction to access the V-Safe system.   [name] was instructed to call 911 with any severe reactions post vaccine: Marland Kitchen Difficulty breathing  . Swelling of your face and throat  . A fast heartbeat  . A bad rash all over your body  . Dizziness and weakness    @[id number] @

## 2019-12-11 NOTE — Progress Notes (Signed)
   Covid-19 Vaccination Clinic  Name:  Mary Murillo    MRN: [id number] DOB: 1951/07/15  12/11/2019  Mary Murillo was observed post Covid-19 immunization for 15 minutes without incidence. She was provided with Vaccine Information Sheet and instruction to access the V-Safe system.   Mary Murillo was instructed to call 911 with any severe reactions post vaccine: Marland Kitchen Difficulty breathing  . Swelling of your face and throat  . A fast heartbeat  . A bad rash all over your body  . Dizziness and weakness    Immunizations Administered    Name Date Dose VIS Date Route   Pfizer COVID-19 Vaccine 12/11/2019  8:25 AM 0.3 mL 10/14/2019 Intramuscular   Manufacturer: [place]   Lot: [id number]   [place]: [id number]

## 2019-12-25 ENCOUNTER — Ambulatory Visit: Payer: Medicare Other

## 2020-01-04 ENCOUNTER — Ambulatory Visit: Payer: Medicare PPO | Attending: Internal Medicine

## 2020-01-04 DIAGNOSIS — Z23 Encounter for immunization: Secondary | ICD-10-CM

## 2020-01-04 NOTE — Progress Notes (Signed)
   Covid-19 Vaccination Clinic  Name:  Mary Murillo    MRN: [id number] DOB: [birth year]  01/04/2020  [name] was observed post Covid-19 immunization for 15 minutes without incident. She was provided with Vaccine Information Sheet and instruction to access the V-Safe system.   [name] was instructed to call 911 with any severe reactions post vaccine: Marland Kitchen Difficulty breathing  . Swelling of face and throat  . A fast heartbeat  . A bad rash all over body  . Dizziness and weakness   Immunizations Administered    Name Date Dose VIS Date Route   Pfizer COVID-19 Vaccine 01/04/2020  8:21 AM 0.3 mL 10/14/2019 Intramuscular   Manufacturer: [place]   Lot: [id number]   [place]: [id number]

## 2020-01-17 ENCOUNTER — Other Ambulatory Visit: Payer: Self-pay | Admitting: Internal Medicine

## 2020-01-17 NOTE — Telephone Encounter (Signed)
Please call and make appt for October before refilling

## 2020-01-19 NOTE — Telephone Encounter (Signed)
Left message to call me back need to book CPE in October.

## 2020-01-24 ENCOUNTER — Other Ambulatory Visit: Payer: Medicare PPO | Admitting: Internal Medicine

## 2020-01-24 ENCOUNTER — Other Ambulatory Visit: Payer: Self-pay

## 2020-01-24 DIAGNOSIS — E039 Hypothyroidism, unspecified: Secondary | ICD-10-CM

## 2020-01-24 LAB — TSH: TSH: 2.14 mIU/L (ref 0.40–4.50)

## 2020-02-02 ENCOUNTER — Telehealth: Payer: Self-pay | Admitting: Internal Medicine

## 2020-02-02 ENCOUNTER — Encounter: Payer: Self-pay | Admitting: Internal Medicine

## 2020-02-02 ENCOUNTER — Ambulatory Visit: Payer: Medicare PPO | Admitting: Internal Medicine

## 2020-02-02 ENCOUNTER — Other Ambulatory Visit: Payer: Self-pay

## 2020-02-02 ENCOUNTER — Telehealth: Payer: Self-pay

## 2020-02-02 VITALS — BP 130/88 | HR 71 | Temp 98.3°F | Ht 62.25 in | Wt 178.0 lb

## 2020-02-02 DIAGNOSIS — K140 Glossitis: Secondary | ICD-10-CM

## 2020-02-02 DIAGNOSIS — K121 Other forms of stomatitis: Secondary | ICD-10-CM

## 2020-02-02 MED ORDER — NYSTATIN 500000 UNITS PO TABS: 500000.0000 [IU] | ORAL_TABLET | Freq: Two times a day (BID) | ORAL | 1 refills | Status: DC

## 2020-02-02 NOTE — Telephone Encounter (Signed)
Needs OV.  

## 2020-02-02 NOTE — Telephone Encounter (Signed)
Left voice mail for Dr. Sandra Cockayne to call me.

## 2020-02-02 NOTE — Progress Notes (Signed)
   Subjective:    Patient ID: Mary Murillo, female    DOB: [birth year], 69 y.o.   MRN: [id number]  HPI 69 year old Female had recent periodontal surgery March 16 by Dr. Sandra Cockayne and was on Amoxicillin 3 times a day for a week after procedure.  She subsequently developed stomatitis/thrush from antibiotic therapy and was treated by Dr. Sandra Cockayne with Nystatin. Still has persistent symptoms and would like more nystatin.     Review of Systems see above has developed irritated tongue     Objective:   Physical Exam  Blood pressure 130/88 pulse 71 temperature 98.3 degrees orally pulse oximetry 97% weight 178 pounds BMI 32.30.  She has mild glossitis and stomatitis secondary to recent antibiotic therapy.  This is consistent with Candidiasis.      Assessment & Plan:  Stomatitis  Glossitis  The symptoms are secondary to antibiotic therapy with amoxicillin and are c/w thrush/Candidiasis  Plan: Have discussed at length with Dr. Sandra Cockayne today by phone.  I am comfortable prescribing nystatin 500,000 units (1 tablet) twice daily for 10 days with 1 refill.  Patient will let me know if symptoms do not improve.  Time spent including telephone call to Dr. Sandra Cockayne as well as office visit is 20 minutes

## 2020-02-02 NOTE — Telephone Encounter (Signed)
12:55 pm. Initially called Dr. Lewayne Bunting office and got voice mail. Left message for her to call me. Have not heard back. However, we checked our records and Nystatin tablets were prescribed several years ago to be taken with each dose of antibiotic and not for an extended period of time. This has worked well in the past prior to the availability of Diflucan. Left another message to this effect on the office voice mail.  Dr. Sandra Cockayne returned call. She gave patient Amoxicillin 3 times a day for a week after procedure In mid March. Pt just called back complaining of thrush yesterday. Patient was offered Diflucan but refused. Wanted Nystatin tablets which have been prescribed to her for a few days but patient wanted longer course. Periodontist not comfortable with that. Advised Dr. Sandra Cockayne that I could see patient in follow up if necessary for thrush and she will advise patient.

## 2020-02-02 NOTE — Telephone Encounter (Signed)
Patient called and said she had talk with Dr Sandra Cockayne and she only has enough Nystatin to get thru tomorrow, so she wants an office visit as soon as possible so she can get some more Nystatin,

## 2020-02-02 NOTE — Telephone Encounter (Signed)
Dr. Michel Santee periodontist called she did surgery on patient on 3/16 she placed patient on some antibiotics and patient has developed thrush all over her tongue and she was going to prescribe DIFLUCAN for her, patient refused stated she has had this before and you put her on NYSTATIN for months. Dr. Sandra Cockayne was going to prescribe NYSTAIN for a few days but patient refused patient stated you put her on NYSTATIN for months Dr. Sandra Cockayne states she doesn't feel comfortable prescribing NYSTATIN for months and she was calling to see what has been done for patient in the past?   Call back number [phone]

## 2020-02-02 NOTE — Telephone Encounter (Signed)
scheduled

## 2020-02-14 DIAGNOSIS — Z03818 Encounter for observation for suspected exposure to other biological agents ruled out: Secondary | ICD-10-CM | POA: Diagnosis not present

## 2020-02-14 DIAGNOSIS — Z20828 Contact with and (suspected) exposure to other viral communicable diseases: Secondary | ICD-10-CM | POA: Diagnosis not present

## 2020-02-18 ENCOUNTER — Encounter: Payer: Self-pay | Admitting: Internal Medicine

## 2020-02-18 NOTE — Patient Instructions (Addendum)
Discussed situation with patient's periodontist.  Will prescribe nystatin tablets twice daily for 10 days with 1 refill.  Patient will let me know if she does not improve.  This is worked well for her in the past.

## 2020-02-19 ENCOUNTER — Encounter: Payer: Self-pay | Admitting: Internal Medicine

## 2020-02-19 ENCOUNTER — Telehealth (INDEPENDENT_AMBULATORY_CARE_PROVIDER_SITE_OTHER): Payer: Medicare PPO | Admitting: Internal Medicine

## 2020-02-19 DIAGNOSIS — H9201 Otalgia, right ear: Secondary | ICD-10-CM

## 2020-02-19 NOTE — Telephone Encounter (Signed)
Patient was recently treated for thrush with Nystatin tablets after peridontal procedure in March for which she received short course of antibiotics. About April 8th she had fever up to 102 degrees and could not get warm. She stopped Nystatin tabs. She has sweating. She spoke with her Naturopath who thought it was related to yeast infection. She had negative Covid test this past week on April14. Her right ear has been stopped up. Thinks it may be allergy related vs. Dental issue. Not scheduled to see Periodontist until 6 weeks out from her procedure but of course could be seen sooner I would think if necessary.  Patient wants to come have lab work tomorrow. She will have CBC, U/A C-met and sed rate at 10 am Monday April 19. Dx is fever. Phone conversation with patient and My Chart Message reviewed today.

## 2020-02-20 ENCOUNTER — Other Ambulatory Visit: Payer: Self-pay

## 2020-02-20 ENCOUNTER — Other Ambulatory Visit: Payer: Medicare PPO | Admitting: Internal Medicine

## 2020-02-20 DIAGNOSIS — R509 Fever, unspecified: Secondary | ICD-10-CM

## 2020-02-20 LAB — CBC WITH DIFFERENTIAL/PLATELET
Absolute Monocytes: 713 cells/uL (ref 200–950)
Basophils Absolute: 33 cells/uL (ref 0–200)
Basophils Relative: 0.5 %
Eosinophils Absolute: 79 cells/uL (ref 15–500)
Eosinophils Relative: 1.2 %
HCT: 42.9 % (ref 35.0–45.0)
Hemoglobin: 14.3 g/dL (ref 11.7–15.5)
Lymphs Abs: 2039 cells/uL (ref 850–3900)
MCH: 30.8 pg (ref 27.0–33.0)
MCHC: 33.3 g/dL (ref 32.0–36.0)
MCV: 92.5 fL (ref 80.0–100.0)
MPV: 9.8 fL (ref 7.5–12.5)
Monocytes Relative: 10.8 %
Neutro Abs: 3736 cells/uL (ref 1500–7800)
Neutrophils Relative %: 56.6 %
Platelets: 562 10*3/uL — ABNORMAL HIGH (ref 140–400)
RBC: 4.64 10*6/uL (ref 3.80–5.10)
RDW: 12.6 % (ref 11.0–15.0)
Total Lymphocyte: 30.9 %
WBC: 6.6 10*3/uL (ref 3.8–10.8)

## 2020-02-20 LAB — COMPLETE METABOLIC PANEL WITH GFR
AG Ratio: 1.2 (calc) (ref 1.0–2.5)
ALT: 16 U/L (ref 6–29)
AST: 14 U/L (ref 10–35)
Albumin: 3.7 g/dL (ref 3.6–5.1)
Alkaline phosphatase (APISO): 94 U/L (ref 37–153)
BUN: 12 mg/dL (ref 7–25)
CO2: 30 mmol/L (ref 20–32)
Calcium: 10.6 mg/dL — ABNORMAL HIGH (ref 8.6–10.4)
Chloride: 104 mmol/L (ref 98–110)
Creat: 0.88 mg/dL (ref 0.50–0.99)
GFR, Est African American: 78 mL/min/{1.73_m2} (ref >=60)
GFR, Est Non African American: 67 mL/min/{1.73_m2} (ref >=60)
Globulin: 3.2 g/dL (calc) (ref 1.9–3.7)
Glucose, Bld: 88 mg/dL (ref 65–139)
Potassium: 4.7 mmol/L (ref 3.5–5.3)
Sodium: 140 mmol/L (ref 135–146)
Total Bilirubin: 0.4 mg/dL (ref 0.2–1.2)
Total Protein: 6.9 g/dL (ref 6.1–8.1)

## 2020-02-20 LAB — SEDIMENTATION RATE: Sed Rate: 43 mm/h — ABNORMAL HIGH (ref 0–30)

## 2020-02-21 ENCOUNTER — Telehealth: Payer: Self-pay

## 2020-02-21 DIAGNOSIS — H43393 Other vitreous opacities, bilateral: Secondary | ICD-10-CM | POA: Diagnosis not present

## 2020-02-21 DIAGNOSIS — H43813 Vitreous degeneration, bilateral: Secondary | ICD-10-CM | POA: Diagnosis not present

## 2020-02-21 DIAGNOSIS — H40053 Ocular hypertension, bilateral: Secondary | ICD-10-CM | POA: Diagnosis not present

## 2020-02-21 DIAGNOSIS — H04123 Dry eye syndrome of bilateral lacrimal glands: Secondary | ICD-10-CM | POA: Diagnosis not present

## 2020-02-21 DIAGNOSIS — H40013 Open angle with borderline findings, low risk, bilateral: Secondary | ICD-10-CM | POA: Diagnosis not present

## 2020-02-21 NOTE — Telephone Encounter (Signed)
The results  and comments are on Epic. I can see her tomorrow or Thursday if she feels she needs appt.

## 2020-02-21 NOTE — Telephone Encounter (Signed)
Patient notified and states that she understands.  She will look at them and call back if she needs to schedule appointment.

## 2020-02-21 NOTE — Telephone Encounter (Signed)
Patient called wanting to know about lab work and if she needed to have an appointment.  I gave her the results and she wanted to know if you needed to see here?   She states that she is in town and lives about 5 mins away.  Please review and advise.  Thank you

## 2020-02-23 ENCOUNTER — Other Ambulatory Visit: Payer: Self-pay

## 2020-02-23 ENCOUNTER — Encounter: Payer: Self-pay | Admitting: Internal Medicine

## 2020-02-23 ENCOUNTER — Ambulatory Visit (INDEPENDENT_AMBULATORY_CARE_PROVIDER_SITE_OTHER): Payer: Medicare PPO | Admitting: Internal Medicine

## 2020-02-23 VITALS — BP 120/80 | HR 75 | Temp 98.2°F | Ht 62.0 in | Wt 176.0 lb

## 2020-02-23 DIAGNOSIS — R5381 Other malaise: Secondary | ICD-10-CM

## 2020-02-23 NOTE — Patient Instructions (Signed)
Lab results discussed.  Continue to monitor symptoms and return as needed.  Keep follow-up appointment with periodontist.

## 2020-02-23 NOTE — Progress Notes (Signed)
   Subjective:    Patient ID: Mary Murillo, female    DOB: [birth year], 69 y.o.   MRN: [id number]  HPI 69 year old Female in to discuss recent lab results.She thinks fever she had was due to yeast infection with yeast dying off. She is watching sugar in diet.  Patient was recently treated for thrush with nystatin tablets after a peer abdominal procedure in March for which she received a short course of antibiotics.  Around April 8 she had fever up to 102 degrees and could not get warm.  She stopped nystatin tablets.  She had sweating.  She spoke with her naturopath who thought it was related to yeast infection.  She had negative Covid test week of April 14.  She has upcoming appointment with periodontist.  Recent CBC showed normal white blood cell count and normal hemoglobin.  Platelets slightly elevated at 562,000 and previously had been in the 300,000 range.  C-Met was normal with the exception of very slightly elevated calcium at 10.6.  Sed rate elevated at 43.  Explained to patient that elevated sed rate was nonspecific and was not a high sed rate.  That coupled with elevated platelet count would indicate she might have some inflammation going on but very nonspecific findings.  She has follow-up appointment with periodontist in the near future.  Infection related to procedure needs to be ruled out.  She has not had any further episodes of fever.  Beginning to feel better.     Patient intolerant of Terazol 7 vaginal- says it caused systemic reaction and also surgical steel implements. Review of Systems     Objective:   Physical Exam Temperature 98.2 degrees orally blood pressure 120/80 pulse 75 pulse oximetry 98% weight 176 pounds BMI 32.19 she is in no acute distress.  She was not examined today.  Spent 10 minutes speaking with her about these results.  She will call if she has further concerns.       Assessment & Plan:  Recent febrile illness-etiology unclear but started after taking  antibiotics for periodontal procedure.  She was treated recently with nystatin tablets for thrush after taking antibiotics for periodontal procedure.  Plan: Continue to monitor symptoms and return as needed.

## 2020-03-05 DIAGNOSIS — D1801 Hemangioma of skin and subcutaneous tissue: Secondary | ICD-10-CM | POA: Diagnosis not present

## 2020-03-05 DIAGNOSIS — D225 Melanocytic nevi of trunk: Secondary | ICD-10-CM | POA: Diagnosis not present

## 2020-03-05 DIAGNOSIS — D2271 Melanocytic nevi of right lower limb, including hip: Secondary | ICD-10-CM | POA: Diagnosis not present

## 2020-03-05 DIAGNOSIS — L821 Other seborrheic keratosis: Secondary | ICD-10-CM | POA: Diagnosis not present

## 2020-03-05 DIAGNOSIS — L814 Other melanin hyperpigmentation: Secondary | ICD-10-CM | POA: Diagnosis not present

## 2020-04-13 ENCOUNTER — Other Ambulatory Visit: Payer: Self-pay

## 2020-04-13 ENCOUNTER — Ambulatory Visit (INDEPENDENT_AMBULATORY_CARE_PROVIDER_SITE_OTHER): Payer: Medicare PPO | Admitting: Family Medicine

## 2020-04-13 ENCOUNTER — Encounter: Payer: Self-pay | Admitting: Family Medicine

## 2020-04-13 ENCOUNTER — Ambulatory Visit: Payer: Medicare PPO

## 2020-04-13 ENCOUNTER — Ambulatory Visit: Payer: Self-pay

## 2020-04-13 DIAGNOSIS — M2351 Chronic instability of knee, right knee: Secondary | ICD-10-CM | POA: Diagnosis not present

## 2020-04-13 DIAGNOSIS — M25562 Pain in left knee: Secondary | ICD-10-CM

## 2020-04-13 DIAGNOSIS — M79671 Pain in right foot: Secondary | ICD-10-CM

## 2020-04-13 DIAGNOSIS — M25561 Pain in right knee: Secondary | ICD-10-CM

## 2020-04-13 NOTE — Progress Notes (Signed)
Office Visit Note   Patient: Mary Murillo           Date of Birth: August 15, 1951           MRN: [id number] Visit Date: 04/13/2020 Requested by: Elby Showers, MD [address] [place],  [place] [address] PCP: Elby Showers, MD  Subjective: Chief Complaint  Patient presents with  . Right Knee - Pain    [name] sent her here. instability, popping out, feels that something is not right.  . Right Foot - Pain    HPI: She is here with bilateral knee pain and ankle pain.  Her right knee bothers her the most.  She states that in the past few months her knees have become unstable.  They pop frequently, the right lung much more often than the left.  She has not fallen, usually is worst when doing side to side movements.  She has chronically tight quadriceps but is working with [name] recently and dry needling seems to be helping with that.  The instability symptoms have persisted.  The right knee swells frequently.  She has not had any locking symptoms.  Her ankles pop quite a bit as well.  They do not hurt as much as the knee.  She does not take anything for pain.  She will be going to [place] in August to visit her daughter.              ROS:   All other systems were reviewed and are negative.  Objective: Vital Signs: There were no vitals taken for this visit.  Physical Exam:  General:  Alert and oriented, in no acute distress. Pulm:  Breathing unlabored. Psy:  Normal mood, congruent affect. Skin: No erythema Knees: She has 2+ effusion on the right, trace on the left.  No warmth or erythema.  No pain with patella compression or apprehension test in either knee.  The right knee feels lax with Lachman's.  Mild laxity with varus and valgus stress but still a solid endpoints.  No significant joint line tenderness, knee pain with McMurray's. Ankles: She has 1+ laxity with drawer and talar tilt test in both ankles.  She has popping with active range of motion in both.  I do not  feel any tendon subluxation.  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  There is significant irregularity of the patellofemoral joint.  No sign of loose body.  XR Knee 1-2 Views Right  Result Date: 04/13/2020 X-rays right knee reveal moderate tricompartmental DJD with periarticular spurring and joint space narrowing.  No sign of loose body.   Assessment & Plan: 1.  Right greater than left knee instability symptoms, suspicious for either ACL deficiency or meniscus tear.  Could be patellofemoral subluxation, but she did not have much pain with manipulation today. -We will proceed with MRI of the right knee which is the most symptomatic.  Based on the results, we could contemplate aspiration and injection if symptoms become more severe, or possibly gel injections.  She wants to avoid surgery if at all possible.  2.  Bilateral ankle chronic instability -Symptoms are manageable right now.      Procedures: No procedures performed  No notes on file     PMFS History: Patient Active Problem List   Diagnosis Date Noted  . Insomnia 05/29/2012  . History of asthma 05/29/2012  . Hypothyroidism 04/30/2011  . Hyperlipidemia 04/30/2011  .  Vitamin D deficiency 04/30/2011  . Allergic rhinitis 04/30/2011  . Depression 04/30/2011   Past Medical History:  Diagnosis Date  . Allergy   . Depression   . Hyperlipidemia   . Hypothyroidism   . Vitamin D deficiency     Family History  Problem Relation Age of Onset  . Cancer Sister        breast/ died    Past Surgical History:  Procedure Laterality Date  . ABDOMINAL HYSTERECTOMY    . BREAST CYST ASPIRATION Left   . VAGINAL HYSTERECTOMY  1991   Social History   Occupational History  . Not on file  Tobacco Use  . Smoking status: Former Research scientist (life sciences)  . Smokeless tobacco: Never Used  Substance and Sexual Activity  . Alcohol use: Yes    Alcohol/week: 2.0  standard drinks    Types: 2 drink(s) per week  . Drug use: No  . Sexual activity: Not on file

## 2020-04-18 ENCOUNTER — Telehealth: Payer: Self-pay | Admitting: Family Medicine

## 2020-04-18 ENCOUNTER — Other Ambulatory Visit: Payer: Self-pay | Admitting: Internal Medicine

## 2020-04-18 DIAGNOSIS — Z1231 Encounter for screening mammogram for malignant neoplasm of breast: Secondary | ICD-10-CM

## 2020-04-18 NOTE — Telephone Encounter (Signed)
Patient called.   [employer] is asking that we send over approval for her to be receiving dry needle injections. She was unable to provide a fax number but the provider line number is listed below.  [employer]: [phone]

## 2020-04-18 NOTE — Telephone Encounter (Signed)
I'm not sure I understand, we have to send THEM approval?

## 2020-04-21 ENCOUNTER — Other Ambulatory Visit: Payer: Self-pay

## 2020-04-21 ENCOUNTER — Ambulatory Visit
Admission: RE | Admit: 2020-04-21 | Discharge: 2020-04-21 | Disposition: A | Discharge status: Home / Self Care | Payer: Medicare PPO | Source: Ambulatory Visit | Attending: Family Medicine | Admitting: Family Medicine

## 2020-04-21 DIAGNOSIS — M2351 Chronic instability of knee, right knee: Secondary | ICD-10-CM

## 2020-04-21 DIAGNOSIS — M25561 Pain in right knee: Secondary | ICD-10-CM

## 2020-04-23 ENCOUNTER — Telehealth: Payer: Self-pay | Admitting: Family Medicine

## 2020-04-23 NOTE — Telephone Encounter (Signed)
MRI shows intact ligaments.  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  The lateral meniscus is torn.

## 2020-04-26 ENCOUNTER — Ambulatory Visit (INDEPENDENT_AMBULATORY_CARE_PROVIDER_SITE_OTHER): Payer: Medicare PPO

## 2020-04-26 ENCOUNTER — Other Ambulatory Visit: Payer: Self-pay

## 2020-04-26 DIAGNOSIS — Z1231 Encounter for screening mammogram for malignant neoplasm of breast: Secondary | ICD-10-CM | POA: Diagnosis not present

## 2020-05-12 ENCOUNTER — Other Ambulatory Visit: Payer: Medicare PPO

## 2020-06-05 ENCOUNTER — Encounter: Payer: Self-pay | Admitting: Gastroenterology

## 2020-07-17 ENCOUNTER — Other Ambulatory Visit: Payer: Self-pay | Admitting: Internal Medicine

## 2020-07-24 ENCOUNTER — Ambulatory Visit (AMBULATORY_SURGERY_CENTER): Payer: Self-pay

## 2020-07-24 ENCOUNTER — Encounter: Payer: Self-pay | Admitting: Gastroenterology

## 2020-07-24 ENCOUNTER — Other Ambulatory Visit: Payer: Self-pay

## 2020-07-24 VITALS — Ht 62.0 in | Wt 181.0 lb

## 2020-07-24 DIAGNOSIS — Z1211 Encounter for screening for malignant neoplasm of colon: Secondary | ICD-10-CM

## 2020-07-24 MED ORDER — NA SULFATE-K SULFATE-MG SULF 17.5-3.13-1.6 GM/177ML PO SOLN: 1.0000 | Freq: Once | ORAL | 0 refills | Status: AC

## 2020-07-24 NOTE — Progress Notes (Signed)
No egg or soy allergy known to patient  No issues with past sedation with any surgeries or procedures No intubation problems in the past  No FH of Malignant Hyperthermia No diet pills per patient No home 02 use per patient  No blood thinners per patient  Pt denies issues with constipation  No A fib or A flutter  EMMI video via MyChart  COVID 19 guidelines implemented in PV today with Pt and RN  COVID vaccines completed on 01/2020 per pt;  Due to the COVID-19 pandemic we are asking patients to follow these guidelines. Please only bring one care partner. Please be aware that your care partner may wait in the car in the parking lot or if they feel like they will be too hot to wait in the car, they may wait in the lobby on the 4th floor. All care partners are required to wear a mask the entire time (we do not have any that we can provide them), they need to practice social distancing, and we will do a Covid check for all patient's and care partners when you arrive. Also we will check their temperature and your temperature. If the care partner waits in their car they need to stay in the parking lot the entire time and we will call them on their cell phone when the patient is ready for discharge so they can bring the car to the front of the building. Also all patient's will need to wear a mask into building. 

## 2020-08-07 ENCOUNTER — Encounter: Payer: Self-pay | Admitting: Gastroenterology

## 2020-08-07 ENCOUNTER — Other Ambulatory Visit: Payer: Self-pay

## 2020-08-07 ENCOUNTER — Ambulatory Visit (AMBULATORY_SURGERY_CENTER): Payer: Medicare PPO | Admitting: Gastroenterology

## 2020-08-07 VITALS — BP 111/47 | HR 56 | Temp 97.1°F | Resp 12 | Ht 62.0 in | Wt 181.0 lb

## 2020-08-07 DIAGNOSIS — D122 Benign neoplasm of ascending colon: Secondary | ICD-10-CM | POA: Diagnosis not present

## 2020-08-07 DIAGNOSIS — D123 Benign neoplasm of transverse colon: Secondary | ICD-10-CM | POA: Diagnosis not present

## 2020-08-07 DIAGNOSIS — D124 Benign neoplasm of descending colon: Secondary | ICD-10-CM

## 2020-08-07 DIAGNOSIS — Z1211 Encounter for screening for malignant neoplasm of colon: Secondary | ICD-10-CM | POA: Diagnosis not present

## 2020-08-07 MED ORDER — SODIUM CHLORIDE 0.9 % IV SOLN: 500.0000 mL | Freq: Once | INTRAVENOUS | Status: DC

## 2020-08-07 NOTE — Progress Notes (Signed)
Lidocaine 2% 53mL IV given per Dr. Tarri Glenn.

## 2020-08-07 NOTE — Op Note (Signed)
[facility] Patient Name: Mary Murillo Procedure Date: 08/07/2020 7:21 AM MRN: [id number] Endoscopist: Thornton Park MD, MD Age: 69 Referring MD:  Date of Birth: 1950-12-29 Gender: Female Account #: [id number] Procedure:                Colonoscopy Indications:              Screening for colorectal malignant neoplasm                           No polyps on colonoscopies with Dr. Olevia Perches in 2002                            and 2011                           No known family history of colon cancer or polyps Medicines:                Monitored Anesthesia Care Procedure:                Pre-Anesthesia Assessment:                           - Prior to the procedure, a History and Physical                            was performed, and patient medications and                            allergies were reviewed. The patient's tolerance of                            previous anesthesia was also reviewed. The risks                            and benefits of the procedure and the sedation                            options and risks were discussed with the patient.                            All questions were answered, and informed consent                            was obtained. Prior Anticoagulants: The patient has                            taken no previous anticoagulant or antiplatelet                            agents. ASA Grade Assessment: II - A patient with                            mild systemic disease. After reviewing the risks  and benefits, the patient was deemed in                            satisfactory condition to undergo the procedure.                           After obtaining informed consent, the colonoscope                            was passed under direct vision. Throughout the                            procedure, the patient's blood pressure, pulse, and                            oxygen saturations were monitored continuously. The                             Colonoscope was introduced through the anus and                            advanced to the 3 cm into the ileum. A second                            forward view of the right colon was performed. The                            colonoscopy was performed without difficulty. The                            patient tolerated the procedure well. The quality                            of the bowel preparation was good. The ileocecal                            valve, appendiceal orifice, and rectum were                            photographed. Scope In: 8:15:29 AM Scope Out: 8:34:24 AM Scope Withdrawal Time: 0 hours 15 minutes 25 seconds  Total Procedure Duration: 0 hours 18 minutes 55 seconds  Findings:                 Non-bleeding external and internal hemorrhoids were                            found.                           Multiple small and large-mouthed diverticula were                            found in the entire colon.  A 2 mm polyp was found in the descending colon. The                            polyp was sessile. The polyp was removed with a                            cold snare. Resection and retrieval were complete.                            Estimated blood loss was minimal.                           A 2 mm polyp was found in the hepatic flexure. The                            polyp was sessile. The polyp was removed with a                            cold snare. Resection and retrieval were complete.                            Estimated blood loss was minimal.                           A 2 mm polyp was found in the ascending colon. The                            polyp was flat. The polyp was removed with a cold                            snare. Resection and retrieval were complete.                            Estimated blood loss was minimal.                           The exam was otherwise without abnormality on                             direct and retroflexion views. Complications:            No immediate complications. Estimated blood loss:                            Minimal. Estimated Blood Loss:     Estimated blood loss was minimal. Impression:               - Non-bleeding external and internal hemorrhoids.                           - Diverticulosis in the entire examined colon.                           - One 2 mm polyp in  the descending colon, removed                            with a cold snare. Resected and retrieved.                           - One 2 mm polyp at the hepatic flexure, removed                            with a cold snare. Resected and retrieved.                           - One 2 mm polyp in the ascending colon, removed                            with a cold snare. Resected and retrieved.                           - The examination was otherwise normal on direct                            and retroflexion views. Recommendation:           - Patient has a contact number available for                            emergencies. The signs and symptoms of potential                            delayed complications were discussed with the                            patient. Return to normal activities tomorrow.                            Written discharge instructions were provided to the                            patient.                           - Follow a high fiber diet. Drink at least 64                            ounces of water daily. Add a daily stool bulking                            agent such as psyllium (an exampled would be                            Metamucil).                           - Continue present medications.                           -  Await pathology results.                           - Repeat colonoscopy date to be determined after                            pending pathology results are reviewed for                            surveillance. If all 3 polyps are adenomas, will                             repeat colonoscopy in 3 years.                           - Emerging evidence supports eating a diet of                            fruits, vegetables, grains, calcium, and yogurt                            while reducing red meat and alcohol may reduce the                            risk of colon cancer.                           - Thank you for allowing me to be involved in your                            colon cancer prevention. Thornton Park MD, MD 08/07/2020 8:42:59 AM This report has been signed electronically.

## 2020-08-07 NOTE — Patient Instructions (Addendum)
Handouts Provided:  Polyps, Diverticulosis and High Fiber Diet ° °YOU HAD AN ENDOSCOPIC PROCEDURE TODAY AT THE [facility]:   Refer to the procedure report that was given to you for any specific questions about what was found during the examination.  If the procedure report does not answer your questions, please call your gastroenterologist to clarify.  If you requested that your care partner not be given the details of your procedure findings, then the procedure report has been included in a sealed envelope for you to review at your convenience later. ° °YOU SHOULD EXPECT: Some feelings of bloating in the abdomen. Passage of more gas than usual.  Walking can help get rid of the air that was put into your GI tract during the procedure and reduce the bloating. If you had a lower endoscopy (such as a colonoscopy or flexible sigmoidoscopy) you may notice spotting of blood in your stool or on the toilet paper. If you underwent a bowel prep for your procedure, you may not have a normal bowel movement for a few days. ° °Please Note:  You might notice some irritation and congestion in your nose or some drainage.  This is from the oxygen used during your procedure.  There is no need for concern and it should clear up in a day or so. ° °SYMPTOMS TO REPORT IMMEDIATELY: ° °Following lower endoscopy (colonoscopy or flexible sigmoidoscopy): ° Excessive amounts of blood in the stool ° Significant tenderness or worsening of abdominal pains ° Swelling of the abdomen that is new, acute ° Fever of 100°F or higher ° °For urgent or emergent issues, a gastroenterologist can be reached at any hour by calling [phone]. °Do not use MyChart messaging for urgent concerns.  ° ° °DIET:  We do recommend a small meal at first, but then you may proceed to your regular diet.  Drink plenty of fluids but you should avoid alcoholic beverages for 24 hours. ° °ACTIVITY:  You should plan to take it easy for the rest of today and you  should NOT DRIVE or use heavy machinery until tomorrow (because of the sedation medicines used during the test).   ° °FOLLOW UP: °Our staff will call the number listed on your records 48-72 hours following your procedure to check on you and address any questions or concerns that you may have regarding the information given to you following your procedure. If we do not reach you, we will leave a message.  We will attempt to reach you two times.  During this call, we will ask if you have developed any symptoms of COVID 19. If you develop any symptoms (ie: fever, flu-like symptoms, shortness of breath, cough etc.) before then, please call [phone].  If you test positive for Covid 19 in the 2 weeks post procedure, please call and report this information to us.   ° °If any biopsies were taken you will be contacted by phone or by letter within the next 1-3 weeks.  Please call us at [phone] if you have not heard about the biopsies in 3 weeks.  ° ° °SIGNATURES/CONFIDENTIALITY: °You and/or your care partner have signed paperwork which will be entered into your electronic medical record.  These signatures attest to the fact that that the information above on your After Visit Summary has been reviewed and is understood.  Full responsibility of the confidentiality of this discharge information lies with you and/or your care-partner. ° °

## 2020-08-07 NOTE — Progress Notes (Signed)
Called to room to assist during endoscopic procedure.  Patient ID and intended procedure confirmed with present staff. Received instructions for my participation in the procedure from the performing physician.  

## 2020-08-07 NOTE — Progress Notes (Signed)
pt tolerated well. VSS. awake and to recovery. Report given to RN.  

## 2020-08-07 NOTE — Progress Notes (Signed)
VS-CW  Pt's states no medical or surgical changes since previsit or office visit.  

## 2020-08-09 ENCOUNTER — Telehealth: Payer: Self-pay

## 2020-08-09 NOTE — Telephone Encounter (Signed)
  Follow up Call-  Call back number 08/07/2020  Post procedure Call Back phone  # [phone]  Permission to leave phone message Yes  Some recent data might be hidden     Patient questions:  Do you have a fever, pain , or abdominal swelling? No. Pain Score  0 *  Have you tolerated food without any problems? Yes.    Have you been able to return to your normal activities? Yes.    Do you have any questions about your discharge instructions: Diet   No. Medications  No. Follow up visit  No.  Do you have questions or concerns about your Care? No.  Actions: * If pain score is 4 or above: No action needed, pain <4.  1. Have you developed a fever since your procedure? no  2.   Have you had an respiratory symptoms (SOB or cough) since your procedure? no  3.   Have you tested positive for COVID 19 since your procedure no  4.   Have you had any family members/close contacts diagnosed with the COVID 19 since your procedure?  no   If yes to any of these questions please route to Joylene John, RN and Joella Prince, RN

## 2020-08-10 ENCOUNTER — Encounter: Payer: Self-pay | Admitting: Gastroenterology

## 2020-08-14 ENCOUNTER — Other Ambulatory Visit: Payer: Medicare PPO | Admitting: Internal Medicine

## 2020-08-14 ENCOUNTER — Other Ambulatory Visit: Payer: Self-pay

## 2020-08-14 DIAGNOSIS — Z0000 Encounter for general adult medical examination without abnormal findings: Secondary | ICD-10-CM | POA: Diagnosis not present

## 2020-08-14 DIAGNOSIS — E7800 Pure hypercholesterolemia, unspecified: Secondary | ICD-10-CM

## 2020-08-14 DIAGNOSIS — E063 Autoimmune thyroiditis: Secondary | ICD-10-CM

## 2020-08-14 NOTE — Addendum Note (Signed)
Addended by: Mady Haagensen on: 08/14/2020 09:08 AM   Modules accepted: Orders

## 2020-08-15 LAB — TSH: TSH: 1.96 mIU/L (ref 0.40–4.50)

## 2020-08-15 LAB — CBC WITH DIFFERENTIAL/PLATELET
Absolute Monocytes: 525 cells/uL (ref 200–950)
Basophils Absolute: 31 cells/uL (ref 0–200)
Basophils Relative: 0.6 %
Eosinophils Absolute: 107 cells/uL (ref 15–500)
Eosinophils Relative: 2.1 %
HCT: 43.6 % (ref 35.0–45.0)
Hemoglobin: 14.6 g/dL (ref 11.7–15.5)
Lymphs Abs: 1984 cells/uL (ref 850–3900)
MCH: 31.3 pg (ref 27.0–33.0)
MCHC: 33.5 g/dL (ref 32.0–36.0)
MCV: 93.6 fL (ref 80.0–100.0)
MPV: 10.1 fL (ref 7.5–12.5)
Monocytes Relative: 10.3 %
Neutro Abs: 2453 cells/uL (ref 1500–7800)
Neutrophils Relative %: 48.1 %
Platelets: 351 10*3/uL (ref 140–400)
RBC: 4.66 10*6/uL (ref 3.80–5.10)
RDW: 12.4 % (ref 11.0–15.0)
Total Lymphocyte: 38.9 %
WBC: 5.1 10*3/uL (ref 3.8–10.8)

## 2020-08-15 LAB — LIPID PANEL
Cholesterol: 226 mg/dL — ABNORMAL HIGH (ref <200)
HDL: 59 mg/dL (ref >=50)
LDL Cholesterol (Calc): 144 mg/dL (calc) — ABNORMAL HIGH
Non-HDL Cholesterol (Calc): 167 mg/dL (calc) — ABNORMAL HIGH (ref <130)
Total CHOL/HDL Ratio: 3.8 (calc) (ref <5.0)
Triglycerides: 109 mg/dL (ref <150)

## 2020-08-15 LAB — COMPLETE METABOLIC PANEL WITH GFR
AG Ratio: 1.4 (calc) (ref 1.0–2.5)
ALT: 15 U/L (ref 6–29)
AST: 14 U/L (ref 10–35)
Albumin: 4 g/dL (ref 3.6–5.1)
Alkaline phosphatase (APISO): 82 U/L (ref 37–153)
BUN: 13 mg/dL (ref 7–25)
CO2: 27 mmol/L (ref 20–32)
Calcium: 9.7 mg/dL (ref 8.6–10.4)
Chloride: 108 mmol/L (ref 98–110)
Creat: 0.8 mg/dL (ref 0.60–0.93)
GFR, Est African American: 87 mL/min/{1.73_m2} (ref >=60)
GFR, Est Non African American: 75 mL/min/{1.73_m2} (ref >=60)
Globulin: 2.9 g/dL (calc) (ref 1.9–3.7)
Glucose, Bld: 96 mg/dL (ref 65–99)
Potassium: 4.1 mmol/L (ref 3.5–5.3)
Sodium: 144 mmol/L (ref 135–146)
Total Bilirubin: 0.9 mg/dL (ref 0.2–1.2)
Total Protein: 6.9 g/dL (ref 6.1–8.1)

## 2020-08-17 ENCOUNTER — Encounter: Payer: Self-pay | Admitting: Internal Medicine

## 2020-08-17 ENCOUNTER — Other Ambulatory Visit: Payer: Self-pay

## 2020-08-17 ENCOUNTER — Ambulatory Visit (INDEPENDENT_AMBULATORY_CARE_PROVIDER_SITE_OTHER): Payer: Medicare PPO | Admitting: Internal Medicine

## 2020-08-17 VITALS — BP 160/100 | HR 74 | Temp 97.9°F | Ht 62.0 in | Wt 181.0 lb

## 2020-08-17 DIAGNOSIS — Z8659 Personal history of other mental and behavioral disorders: Secondary | ICD-10-CM

## 2020-08-17 DIAGNOSIS — E039 Hypothyroidism, unspecified: Secondary | ICD-10-CM

## 2020-08-17 DIAGNOSIS — M8580 Other specified disorders of bone density and structure, unspecified site: Secondary | ICD-10-CM | POA: Diagnosis not present

## 2020-08-17 DIAGNOSIS — Z8619 Personal history of other infectious and parasitic diseases: Secondary | ICD-10-CM | POA: Diagnosis not present

## 2020-08-17 DIAGNOSIS — M170 Bilateral primary osteoarthritis of knee: Secondary | ICD-10-CM

## 2020-08-17 DIAGNOSIS — Z0000 Encounter for general adult medical examination without abnormal findings: Secondary | ICD-10-CM | POA: Diagnosis not present

## 2020-08-17 DIAGNOSIS — M2550 Pain in unspecified joint: Secondary | ICD-10-CM | POA: Diagnosis not present

## 2020-08-17 DIAGNOSIS — Z79899 Other long term (current) drug therapy: Secondary | ICD-10-CM

## 2020-08-17 DIAGNOSIS — Z803 Family history of malignant neoplasm of breast: Secondary | ICD-10-CM | POA: Diagnosis not present

## 2020-08-17 DIAGNOSIS — E7800 Pure hypercholesterolemia, unspecified: Secondary | ICD-10-CM

## 2020-08-17 LAB — POCT URINALYSIS DIPSTICK
Appearance: NEGATIVE
Bilirubin, UA: NEGATIVE
Blood, UA: NEGATIVE
Glucose, UA: NEGATIVE
Ketones, UA: NEGATIVE
Leukocytes, UA: NEGATIVE
Nitrite, UA: NEGATIVE
Odor: NEGATIVE
Protein, UA: NEGATIVE
Spec Grav, UA: 1.015 (ref 1.010–1.025)
Urobilinogen, UA: 0.2 E.U./dL
pH, UA: 6.5 (ref 5.0–8.0)

## 2020-08-17 LAB — B. BURGDORFI ANTIBODIES BY WB
B burgdorferi IgG Abs (IB): POSITIVE — AB
B burgdorferi IgM Abs (IB): NEGATIVE
Lyme Disease 18 kD IgG: NONREACTIVE
Lyme Disease 23 kD IgG: NONREACTIVE
Lyme Disease 23 kD IgM: REACTIVE — AB
Lyme Disease 28 kD IgG: REACTIVE — AB
Lyme Disease 30 kD IgG: NONREACTIVE
Lyme Disease 39 kD IgG: REACTIVE — AB
Lyme Disease 39 kD IgM: NONREACTIVE
Lyme Disease 41 kD IgG: REACTIVE — AB
Lyme Disease 41 kD IgM: NONREACTIVE
Lyme Disease 45 kD IgG: NONREACTIVE
Lyme Disease 58 kD IgG: REACTIVE — AB
Lyme Disease 66 kD IgG: NONREACTIVE
Lyme Disease 93 kD IgG: REACTIVE — AB

## 2020-08-17 LAB — SEDIMENTATION RATE: Sed Rate: 6 mm/h (ref 0–30)

## 2020-08-17 MED ORDER — MELOXICAM 15 MG PO TABS
15.0000 mg | ORAL_TABLET | Freq: Every day | ORAL | 0 refills | Status: DC
Start: 2020-08-17 — End: 2020-11-28

## 2020-08-17 MED ORDER — MELOXICAM 15 MG PO TABS: 15.0000 mg | ORAL_TABLET | Freq: Every day | ORAL | 0 refills | Status: DC

## 2020-08-17 NOTE — Progress Notes (Signed)
Subjective:    Patient ID: Mary Murillo, female    DOB: October 30, 1951, 69 y.o.   MRN: [id number]  HPI  69 year old Female for Medicare wellness, health maintenance exam, and evaluation of medical issues.  See below regarding knee problems which are new.  She has a history of hypothyroidism and mild depression.  History of hyperlipidemia and took Crestor but subsequently discontinued it.  History of asthma, allergic rhinitis, recurrent urinary infections and history of vitamin D deficiency.  History of Candida vaginal infections related to being on antibiotics.  November 1993 she had left fallopian tube removed due to benign mesothelial peritoneal cyst with adhesions.  History of thrombosed hemorrhoid 2003/02/10.  History of tonsillectomy and adenoidectomy 1955/02/10.  C-section 1981-02-09.  Oral mass removed from under her tongue in 02-09-02.  Hysterectomy without oophorectomy 1991.  Colonoscopy done by Dr. Olevia Perches July 2011 with 10-year follow-up recommended.  Repeat study was done October 2021 with adenomatous polyps being removed and 3-year follow-up recommended by Dr. Tarri Glenn.  Social history: Married with 2 adult daughters.  Does not smoke.  Social alcohol consumption.  Husband with history of colon cancer.  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  Husband was a Pharmacist, hospital at [employer] but has retired.  Family history: 1 sister died of metastatic breast cancer.  Parents died in 02-09-18.  Both parents had dementia.  Father had history of strokes.  Daughter with history of Lyme disease.   She always likes to check Lyme titers- these are still pending. Patient wants sed rate.  Patient reports having had tick bites in the past.  Was tested for Lyme disease August 2018 and also in 02-09-18.  She has reactive to 3 of the Lyme immunoglobulins offered with this panel.   2 on the positive results are IgG and one is IgM.  Lab reports that reactivity to at least 5 out of 10  Borrelial proteins need to be present to be be considered positive for Lyme disease..  Review of Systems- diagnosed with bilateral knee osteoarthritis. Going to PT [employer]. Started in September. Wants to try NSAID. Have prescribed Mobic. Sees Dr. Junius Roads.Had MRI of right knee and has jammed left knee. LDL elevated as last year but patient would prefer to wait on starting statin.    Objective:   Physical Exam Blood pressure 160/100 pulse 74 regular temperature 97.9 pulse oximetry 96% BMI 33.11 weight 181 pounds.  Skin warm and dry.  Nodes none.  TMs are clear.  Neck is supple.  No thyromegaly.  No carotid bruits.  Chest is clear to auscultation without rales or wheezing.  Cardiac exam regular rate and rhythm.  Breast without masses.  Abdomen soft nondistended without hepatosplenomegaly masses or tenderness.  Bimanual normal.  Pap deferred due to age.  No lower extremity pitting edema.  Neuro intact without focal deficits.         Assessment & Plan:  Hypothyroidism-TSH normal on levothyroxine 50 mcg daily  Pure hypercholesterolemia-total cholesterol 226 and LDL cholesterol 144-almost the same results as last year.  Patient does not want to be on statin medication.  History of tick bite in 2017-she follows her Lyme titers regularly  Insomnia-has used Ambien on a as needed basis  History of depression treated with Lexapro  Osteoarthritis both knees treated with meloxicam  Plan: Patient will continue to watch her diet and does a lot of exercise.  Continue Lexapro for longstanding history  of depression.  Does not want to be on statin medication.  Continue thyroid replacement medication return in 1 year or as needed.  Subjective:   Patient presents for Medicare Annual/Subsequent preventive examination.  Review Past Medical/Family/Social: See above   Risk Factors  Current exercise habits: Exercises regularly Dietary issues discussed: Low-fat low carbohydrate  Cardiac risk factors:  Hyperlipidemia  Depression Screen  (Note: if answer to either of the following is "Yes", a more complete depression screening is indicated)   Over the past two weeks, have you felt down, depressed or hopeless? No  Over the past two weeks, have you felt little interest or pleasure in doing things? No Have you lost interest or pleasure in daily life? No Do you often feel hopeless? No Do you cry easily over simple problems? No   Activities of Daily Living  In your present state of health, do you have any difficulty performing the following activities?:   Driving? No  Managing money? No  Feeding yourself? No  Getting from bed to chair? No  Climbing a flight of stairs?  Yes due to knee issues Preparing food and eating?: No  Bathing or showering? No  Getting dressed: No  Getting to the toilet? No  Using the toilet:No  Moving around from place to place: No  In the past year have you fallen or had a near fall?  Yes without injury Are you sexually active?  Yes Do you have more than one partner? No   Hearing Difficulties: No  Do you often ask people to speak up or repeat themselves?  Yes Do you experience ringing or noises in your ears?  Yes Do you have difficulty understanding soft or whispered voices?  Yes Do you feel that you have a problem with memory? No Do you often misplace items? No    Home Safety:  Do you have a smoke alarm at your residence? Yes Do you have grab bars in the bathroom?  No Do you have throw rugs in your house?  Yes   Cognitive Testing  Alert? Yes Normal Appearance?Yes  Oriented to person? Yes Place? Yes  Time? Yes  Recall of three objects? Yes  Can perform simple calculations? Yes  Displays appropriate judgment?Yes  Can read the correct time from a watch face?Yes   List the Names of Other Physician/Practitioners you currently use:  See referral list for the physicians patient is currently seeing.     Review of Systems: See above   Objective:      General appearance: Appears younger than stated age Head: Normocephalic, without obvious abnormality, atraumatic  Eyes: conj clear, EOMi PEERLA  Ears: normal TM's and external ear canals both ears  Nose: Nares normal. Septum midline. Mucosa normal. No drainage or sinus tenderness.  Throat: lips, mucosa, and tongue normal; teeth and gums normal  Neck: no adenopathy, no carotid bruit, no JVD, supple, symmetrical, trachea midline and thyroid not enlarged, symmetric, no tenderness/mass/nodules  No CVA tenderness.  Lungs: clear to auscultation bilaterally  Breasts: normal appearance, no masses or tenderness Heart: regular rate and rhythm, S1, S2 normal, no murmur, click, rub or gallop  Abdomen: soft, non-tender; bowel sounds normal; no masses, no organomegaly  Musculoskeletal: ROM normal in all joints, no crepitus, no deformity, Normal muscle strengthen. Back  is symmetric, no curvature. Skin: Skin color, texture, turgor normal. No rashes or lesions  Lymph nodes: Cervical, supraclavicular, and axillary nodes normal.  Neurologic: CN 2 -12 Normal, Normal symmetric reflexes. Normal coordination and  gait  Psych: Alert & Oriented x 3, Mood appear stable.    Assessment:    Annual wellness medicare exam   Plan:    During the course of the visit the patient was educated and counseled about appropriate screening and preventive services including:   Has had 3 COVID-19 immunizations.  Tetanus immunization is up-to-date.     Patient Instructions (the written plan) was given to the patient.  Medicare Attestation  I have personally reviewed:  The patient's medical and social history  Their use of alcohol, tobacco or illicit drugs  Their current medications and supplements  The patient's functional ability including ADLs,fall risks, home safety risks, cognitive, and hearing and visual impairment  Diet and physical activities  Evidence for depression or mood disorders  The patient's weight,  height, BMI, and visual acuity have been recorded in the chart. I have made referrals, counseling, and provided education to the patient based on review of the above and I have provided the patient with a written personalized care plan for preventive services.

## 2020-08-29 ENCOUNTER — Ambulatory Visit (INDEPENDENT_AMBULATORY_CARE_PROVIDER_SITE_OTHER): Payer: Medicare PPO

## 2020-08-29 ENCOUNTER — Other Ambulatory Visit: Payer: Self-pay

## 2020-08-29 DIAGNOSIS — E039 Hypothyroidism, unspecified: Secondary | ICD-10-CM | POA: Diagnosis not present

## 2020-08-29 DIAGNOSIS — Z13820 Encounter for screening for osteoporosis: Secondary | ICD-10-CM

## 2020-08-29 DIAGNOSIS — M8589 Other specified disorders of bone density and structure, multiple sites: Secondary | ICD-10-CM | POA: Diagnosis not present

## 2020-08-31 ENCOUNTER — Other Ambulatory Visit: Payer: Self-pay | Admitting: Internal Medicine

## 2020-09-14 DIAGNOSIS — Z20822 Contact with and (suspected) exposure to covid-19: Secondary | ICD-10-CM | POA: Diagnosis not present

## 2020-09-28 NOTE — Patient Instructions (Signed)
It was a pleasure to see you today.  Continue current medications and follow-up in 1 year.  May take Mobic for osteoarthritis of the knees.  Continue current medications.

## 2020-10-14 ENCOUNTER — Other Ambulatory Visit: Payer: Self-pay | Admitting: Internal Medicine

## 2020-11-27 ENCOUNTER — Telehealth: Payer: Self-pay | Admitting: Family Medicine

## 2020-11-27 ENCOUNTER — Other Ambulatory Visit: Payer: Medicare PPO

## 2020-11-27 DIAGNOSIS — Z20822 Contact with and (suspected) exposure to covid-19: Secondary | ICD-10-CM

## 2020-11-27 NOTE — Telephone Encounter (Signed)
Patient is requesting xrays to take to Dr. Aurea Graff office. Call when ready. [phone]

## 2020-11-28 ENCOUNTER — Other Ambulatory Visit: Payer: Self-pay | Admitting: Internal Medicine

## 2020-11-28 NOTE — Telephone Encounter (Signed)
Called patient and advised that CD was ready for pickup at front desk.  °

## 2020-11-29 LAB — NOVEL CORONAVIRUS, NAA: SARS-CoV-2, NAA: NOT DETECTED

## 2020-11-29 LAB — SARS-COV-2, NAA 2 DAY TAT

## 2020-12-17 ENCOUNTER — Telehealth: Payer: Self-pay | Admitting: Internal Medicine

## 2020-12-17 DIAGNOSIS — M170 Bilateral primary osteoarthritis of knee: Secondary | ICD-10-CM | POA: Diagnosis not present

## 2020-12-17 NOTE — Telephone Encounter (Signed)
Mary Murillo [id number]  Creer Silversmith called to say she just left Dr Catalina Antigua Olin's office and he is going to do total knee replacement on both her knees . He is going to start on the right knee first and 4 weeks later do the left knee. So we may see surgery clearance letters for this. I let her know she may need an office visit.

## 2020-12-25 NOTE — Telephone Encounter (Signed)
Scheduled

## 2021-01-02 NOTE — Patient Instructions (Addendum)
DUE TO COVID-19 ONLY   ONE  VISITOR IS  ALLOWED TO COME WITH YOU AND STAY IN THE WAITING ROOM ONLY DURING PRE OP AND PROCEDURE DAY OF SURGERY.   TWO VISITORS  MAY VISIT WITH YOU AFTER SURGERY IN YOUR PRIVATE ROOM DURING VISITING HOURS ONLY! 10a -8p  YOU NEED TO HAVE A COVID 19 TEST ON___3-11-22____ @_______ , THIS TEST MUST BE DONE BEFORE SURGERY,  COVID TESTING SITE [address] [place] [address],   IT IS ON THE RIGHT GOING OUT [address] APPROXIMATELY  2 MINUTES PAST [employer] ON THE RIGHT. ONCE YOUR COVID TEST IS COMPLETED,  PLEASE BEGIN THE QUARANTINE INSTRUCTIONS AS OUTLINED IN YOUR HANDOUT.                RANYIA WITTING  01/02/2021   Your procedure is scheduled on: 01-15-21   Report to [facility] Main  Entrance   Report to admitting at      0900  AM     Call this number if you have problems the morning of surgery [phone]    Remember: NO SOLID FOOD AFTER MIDNIGHT THE NIGHT PRIOR TO SURGERY. NOTHING BY MOUTH EXCEPT CLEAR LIQUIDS UNTIL     0830am . PLEASE FINISH ENSURE DRINK PER SURGEON ORDER  WHICH NEEDS TO BE COMPLETED AT        0830 am then nothing by mouth  .    CLEAR LIQUID DIET water                                                             Black Coffee and tea, regular and decaf                            Plain Jell-O any favor except red or purple                                      Fruit ices (not with fruit pulp)                                      Iced Popsicles                                 Carbonated beverages, regular and diet                                    Cranberry, grape and apple juices Sports drinks like Gatorade Lightly seasoned clear broth or consume(fat free) Sugar, honey syrup   _____________________________________________________________________     BRUSH YOUR TEETH MORNING OF SURGERY AND RINSE YOUR MOUTH OUT, NO CHEWING GUM CANDY OR MINTS.     Take these medicines the morning of surgery with A SIP  OF WATER: lexapro, levothyroxine  DO NOT TAKE ANY DIABETIC MEDICATIONS DAY OF YOUR SURGERY  You may not have any metal on your body including hair pins and              piercings  Do not wear jewelry, make-up, lotions, powders or perfumes, deodorant             Do not wear nail polish on your fingernails.  Do not shave  48 hours prior to surgery.     Do not bring valuables to the hospital. [place].  Contacts, dentures or bridgework may not be worn into surgery.      Patients discharged the day of surgery will not be allowed to drive home. IF YOU ARE HAVING SURGERY AND GOING HOME THE SAME DAY, YOU MUST HAVE AN ADULT TO DRIVE YOU HOME AND BE WITH YOU FOR 24 HOURS. YOU MAY GO HOME BY TAXI OR UBER OR ORTHERWISE, BUT AN ADULT MUST ACCOMPANY YOU HOME AND STAY WITH YOU FOR 24 HOURS.  Name and phone number of your driver:  Special Instructions: N/A              Please read over the following fact sheets you were given: _____________________________________________________________________             [facility] - Preparing for Surgery Before surgery, you can play an important role.  Because skin is not sterile, your skin needs to be as free of germs as possible.  You can reduce the number of germs on your skin by washing with CHG (chlorahexidine gluconate) soap before surgery.  CHG is an antiseptic cleaner which kills germs and bonds with the skin to continue killing germs even after washing. Please DO NOT use if you have an allergy to CHG or antibacterial soaps.  If your skin becomes reddened/irritated stop using the CHG and inform your nurse when you arrive at Short Stay. Do not shave (including legs and underarms) for at least 48 hours prior to the first CHG shower.  You may shave your face/neck. Please follow these instructions carefully:  1.  Shower with CHG Soap the night before surgery and the  morning of  Surgery.  2.  If you choose to wash your hair, wash your hair first as usual with your  normal  shampoo.  3.  After you shampoo, rinse your hair and body thoroughly to remove the  shampoo.                           4.  Use CHG as you would any other liquid soap.  You can apply chg directly  to the skin and wash                       Gently with a scrungie or clean washcloth.  5.  Apply the CHG Soap to your body ONLY FROM THE NECK DOWN.   Do not use on face/ open                           Wound or open sores. Avoid contact with eyes, ears mouth and genitals (private parts).                       Wash face,  Genitals (private parts) with your normal soap.  6.  Wash thoroughly, paying special attention to the area where your surgery  will be performed.  7.  Thoroughly rinse your body with warm water from the neck down.  8.  DO NOT shower/wash with your normal soap after using and rinsing off  the CHG Soap.                9.  Pat yourself dry with a clean towel.            10.  Wear clean pajamas.            11.  Place clean sheets on your bed the night of your first shower and do not  sleep with pets. Day of Surgery : Do not apply any lotions/deodorants the morning of surgery.  Please wear clean clothes to the hospital/surgery center.  FAILURE TO FOLLOW THESE INSTRUCTIONS MAY RESULT IN THE CANCELLATION OF YOUR SURGERY PATIENT SIGNATURE_________________________________  NURSE SIGNATURE__________________________________  ________________________________________________________________________   Adam Phenix  An incentive spirometer is a tool that can help keep your lungs clear and active. This tool measures how well you are filling your lungs with each breath. Taking long deep breaths may help reverse or decrease the chance of developing breathing (pulmonary) problems (especially infection) following:  A long period of time when you are unable to move or be active. BEFORE  THE PROCEDURE   If the spirometer includes an indicator to show your best effort, your nurse or respiratory therapist will set it to a desired goal.  If possible, sit up straight or lean slightly forward. Try not to slouch.  Hold the incentive spirometer in an upright position. INSTRUCTIONS FOR USE  1. Sit on the edge of your bed if possible, or sit up as far as you can in bed or on a chair. 2. Hold the incentive spirometer in an upright position. 3. Breathe out normally. 4. Place the mouthpiece in your mouth and seal your lips tightly around it. 5. Breathe in slowly and as deeply as possible, raising the piston or the ball toward the top of the column. 6. Hold your breath for 3-5 seconds or for as long as possible. Allow the piston or ball to fall to the bottom of the column. 7. Remove the mouthpiece from your mouth and breathe out normally. 8. Rest for a few seconds and repeat Steps 1 through 7 at least 10 times every 1-2 hours when you are awake. Take your time and take a few normal breaths between deep breaths. 9. The spirometer may include an indicator to show your best effort. Use the indicator as a goal to work toward during each repetition. 10. After each set of 10 deep breaths, practice coughing to be sure your lungs are clear. If you have an incision (the cut made at the time of surgery), support your incision when coughing by placing a pillow or rolled up towels firmly against it. Once you are able to get out of bed, walk around indoors and cough well. You may stop using the incentive spirometer when instructed by your caregiver.  RISKS AND COMPLICATIONS  Take your time so you do not get dizzy or light-headed.  If you are in pain, you may need to take or ask for pain medication before doing incentive spirometry. It is harder to take a deep breath if you are having pain. AFTER USE  Rest and breathe slowly and easily.  It can be helpful to keep track of a log of your  progress.  Your caregiver can provide you with a simple table to help with this. If you are using the spirometer at home, follow these instructions: [place] IF:   You are having difficultly using the spirometer.  You have trouble using the spirometer as often as instructed.  Your pain medication is not giving enough relief while using the spirometer.  You develop fever of 100.5 F (38.1 C) or higher. SEEK IMMEDIATE MEDICAL CARE IF:   You cough up bloody sputum that had not been present before.  You develop fever of 102 F (38.9 C) or greater.  You develop worsening pain at or near the incision site. MAKE SURE YOU:   Understand these instructions.  Will watch your condition.  Will get help right away if you are not doing well or get worse. Document Released: 03/02/2007 Document Revised: 01/12/2012 Document Reviewed: 05/03/2007 [facility] Patient Information [address] [place], [place].   ________________________________________________________________________

## 2021-01-02 NOTE — Progress Notes (Signed)
Please place orders in epic pt. Has a preop scheduled.

## 2021-01-02 NOTE — Progress Notes (Addendum)
PCP - Tedra Senegal, MD Cardiologist - no  PPM/ICD -  Device Orders -  Rep Notified -   Chest x-ray -  EKG -  Stress Test -  ECHO -  Cardiac Cath -   Sleep Study -  CPAP -   Fasting Blood Sugar -  Checks Blood Sugar _____ times a day  Blood Thinner Instructions: Aspirin Instructions:  ERAS Protcol - PRE-SURGERY Ensure  COVID TEST- 3-11  Activity-Able to walk a flight of stairs without SOB  Anesthesia review: Asthma  Patient denies shortness of breath, fever, cough and chest pain at PAT appointment     NONE   All instructions explained to the patient, with a verbal understanding of the material. Patient agrees to go over the instructions while at home for a better understanding. Patient also instructed to self quarantine after being tested for COVID-19. The opportunity to ask questions was provided.

## 2021-01-03 ENCOUNTER — Other Ambulatory Visit: Payer: Self-pay

## 2021-01-03 ENCOUNTER — Other Ambulatory Visit: Payer: Medicare PPO | Admitting: Internal Medicine

## 2021-01-03 DIAGNOSIS — Z01818 Encounter for other preprocedural examination: Secondary | ICD-10-CM | POA: Diagnosis not present

## 2021-01-03 DIAGNOSIS — M25561 Pain in right knee: Secondary | ICD-10-CM

## 2021-01-03 DIAGNOSIS — R7989 Other specified abnormal findings of blood chemistry: Secondary | ICD-10-CM | POA: Diagnosis not present

## 2021-01-04 ENCOUNTER — Encounter: Payer: Self-pay | Admitting: Internal Medicine

## 2021-01-04 ENCOUNTER — Ambulatory Visit: Payer: Medicare PPO | Admitting: Internal Medicine

## 2021-01-04 VITALS — BP 110/80 | HR 68 | Temp 97.9°F | Ht 62.0 in | Wt 183.0 lb

## 2021-01-04 DIAGNOSIS — E7800 Pure hypercholesterolemia, unspecified: Secondary | ICD-10-CM | POA: Diagnosis not present

## 2021-01-04 DIAGNOSIS — Z01818 Encounter for other preprocedural examination: Secondary | ICD-10-CM

## 2021-01-04 DIAGNOSIS — E039 Hypothyroidism, unspecified: Secondary | ICD-10-CM

## 2021-01-04 DIAGNOSIS — M170 Bilateral primary osteoarthritis of knee: Secondary | ICD-10-CM | POA: Diagnosis not present

## 2021-01-04 LAB — CBC WITH DIFFERENTIAL/PLATELET
Absolute Monocytes: 590 cells/uL (ref 200–950)
Basophils Absolute: 30 cells/uL (ref 0–200)
Basophils Relative: 0.6 %
Eosinophils Absolute: 90 cells/uL (ref 15–500)
Eosinophils Relative: 1.8 %
HCT: 44.8 % (ref 35.0–45.0)
Hemoglobin: 14.9 g/dL (ref 11.7–15.5)
Lymphs Abs: 2060 cells/uL (ref 850–3900)
MCH: 30.7 pg (ref 27.0–33.0)
MCHC: 33.3 g/dL (ref 32.0–36.0)
MCV: 92.2 fL (ref 80.0–100.0)
MPV: 10.3 fL (ref 7.5–12.5)
Monocytes Relative: 11.8 %
Neutro Abs: 2230 cells/uL (ref 1500–7800)
Neutrophils Relative %: 44.6 %
Platelets: 370 10*3/uL (ref 140–400)
RBC: 4.86 10*6/uL (ref 3.80–5.10)
RDW: 12.5 % (ref 11.0–15.0)
Total Lymphocyte: 41.2 %
WBC: 5 10*3/uL (ref 3.8–10.8)

## 2021-01-04 LAB — ALBUMIN: Albumin: 4.3 g/dL (ref 3.6–5.1)

## 2021-01-04 LAB — BASIC METABOLIC PANEL
BUN/Creatinine Ratio: 13 (calc) (ref 6–22)
BUN: 12 mg/dL (ref 7–25)
CO2: 29 mmol/L (ref 20–32)
Calcium: 10.4 mg/dL (ref 8.6–10.4)
Chloride: 109 mmol/L (ref 98–110)
Creat: 0.94 mg/dL — ABNORMAL HIGH (ref 0.60–0.93)
Glucose, Bld: 91 mg/dL (ref 65–139)
Potassium: 5.3 mmol/L (ref 3.5–5.3)
Sodium: 144 mmol/L (ref 135–146)

## 2021-01-04 LAB — HEMOGLOBIN A1C
Hgb A1c MFr Bld: 5.6 % of total Hgb (ref <5.7)
Mean Plasma Glucose: 114 mg/dL
eAG (mmol/L): 6.3 mmol/L

## 2021-01-04 LAB — POCT URINALYSIS DIPSTICK
Appearance: NEGATIVE
Bilirubin, UA: NEGATIVE
Blood, UA: NEGATIVE
Glucose, UA: NEGATIVE
Ketones, UA: NEGATIVE
Leukocytes, UA: NEGATIVE
Nitrite, UA: NEGATIVE
Odor: NEGATIVE
Protein, UA: NEGATIVE
Spec Grav, UA: 1.015 (ref 1.010–1.025)
Urobilinogen, UA: 0.2 E.U./dL
pH, UA: 6.5 (ref 5.0–8.0)

## 2021-01-04 LAB — PROTIME-INR
INR: 0.9
Prothrombin Time: 9.5 s (ref 9.0–11.5)

## 2021-01-04 NOTE — Progress Notes (Signed)
   Subjective:    Patient ID: Mary Murillo, female    DOB: [birth year], 70 y.o.   MRN: [id number]  HPI 70 year old Female in excellent physical condition presents  for pre-op evaluation accompanied by her husband.  She is scheduled for Right TKA on March 15 followed by Left TKA on April 26th.   When I saw her in October 2021 she indicated she had been diagnosed with bilateral knee osteoarthritis.  She goes to [employer].  She sees Dr. Junius Roads.  At that time prescribed Mobic for her.  Has considerable pain and this limits her activities.  She likes to travel.  She has a history of hypothyroidism, hyperlipidemia and mild depression.  She tried Crestor but discontinued it.  She has a history of asthma, allergic rhinitis, occasional urinary infections.  History of vitamin D deficiency.  History of Candida vaginal infections related to being on antibiotics.  In November 1993 she had left Fallopian tube removed due to benign mesothelial peritoneal cyst with adhesions.  History of thrombosed hemorrhoid 2004.  Tonsillectomy and adenoidectomy 1956.  C-section 1982.  Oral mass removed from under her tongue in 2003.  Hysterectomy without oophorectomy 1991.  Colonoscopy done October 2021 with adenomatous polyps being removed by Dr. Tarri Glenn with 3-year follow-up recommended.  Social history: Married with 2 adult daughters.  Does not smoke.  Social alcohol consumption.  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  Husband is retired but formally taught at [employer].     Review of Systems  Respiratory: Negative.   Cardiovascular: Negative.   Gastrointestinal: Negative.   Genitourinary: Negative.   Neurological: Negative.   Psychiatric/Behavioral: Negative.        Objective:   Physical Exam  Blood pressure 110/80 pulse 68 regular temperature 97.9 pulse oximetry 95% weight 183 pounds BMI 33.47  Skin is warm and dry.  No cervical adenopathy.  No  carotid bruits.  Chest is clear to auscultation without rales or wheezing.  Cardiac exam: Regular rate and rhythm normal S1 and S2 without murmurs or gallops.  No lower extremity pitting edema.  Neuro: Intact without focal deficits.  Affect felt judgment are normal.      Assessment & Plan:  Osteoarthritis both knees-to have bilateral knee arthroplasties in the near future on  March 15 and April 26 respectively.  She is in excellent physical condition and should be able to undergo these 2 operations close together.  She is motivated to do necessary physical therapy postoperatively.  Husband is supportive.  Hypothyroidism-TSH is normal on thyroid replacement medication  Reviewed additional labs including CBC with differential, being managed, pro time INR, hemoglobin A1c, glucose, BUN and creatinine, electrolytes all of which follow within normal limits.  Plan: She is medically cleared for bilateral arthroplasties.

## 2021-01-07 ENCOUNTER — Encounter: Payer: Self-pay | Admitting: Internal Medicine

## 2021-01-07 DIAGNOSIS — Z0289 Encounter for other administrative examinations: Secondary | ICD-10-CM

## 2021-01-07 NOTE — Patient Instructions (Addendum)
It was a pleasure to see you today.  Good luck with upcoming surgeries.  Your labs are within normal limits.  Health maintenance exam is due in October.

## 2021-01-09 ENCOUNTER — Encounter (HOSPITAL_COMMUNITY)
Admission: RE | Admit: 2021-01-09 | Discharge: 2021-01-09 | Disposition: A | Discharge status: Home / Self Care | Payer: Medicare PPO | Source: Ambulatory Visit | Attending: Orthopedic Surgery | Admitting: Orthopedic Surgery

## 2021-01-09 ENCOUNTER — Encounter (HOSPITAL_COMMUNITY): Payer: Self-pay

## 2021-01-09 ENCOUNTER — Other Ambulatory Visit: Payer: Self-pay

## 2021-01-09 DIAGNOSIS — Z01812 Encounter for preprocedural laboratory examination: Secondary | ICD-10-CM | POA: Diagnosis not present

## 2021-01-09 HISTORY — DX: Other specified postprocedural states: Z98.890

## 2021-01-09 HISTORY — DX: Nausea with vomiting, unspecified: R11.2

## 2021-01-09 HISTORY — DX: Other complications of anesthesia, initial encounter: T88.59XA

## 2021-01-09 LAB — SURGICAL PCR SCREEN
MRSA, PCR: NEGATIVE
Staphylococcus aureus: POSITIVE — AB

## 2021-01-09 NOTE — Telephone Encounter (Signed)
Faxed surgery clearance to [facility] Attn: Derl Barrow [phone], phone [phone] (16 pages) Surgery Clearance, Office notes, labs, medication list, EKG, and H&P

## 2021-01-11 ENCOUNTER — Other Ambulatory Visit (HOSPITAL_COMMUNITY)
Admission: RE | Admit: 2021-01-11 | Discharge: 2021-01-11 | Disposition: A | Discharge status: Home / Self Care | Payer: Medicare PPO | Source: Ambulatory Visit | Attending: Orthopedic Surgery | Admitting: Orthopedic Surgery

## 2021-01-11 DIAGNOSIS — Z20822 Contact with and (suspected) exposure to covid-19: Secondary | ICD-10-CM | POA: Diagnosis not present

## 2021-01-11 DIAGNOSIS — Z01812 Encounter for preprocedural laboratory examination: Secondary | ICD-10-CM | POA: Insufficient documentation

## 2021-01-11 LAB — SARS CORONAVIRUS 2 (TAT 6-24 HRS): SARS Coronavirus 2: NEGATIVE

## 2021-01-14 NOTE — H&P (Signed)
TOTAL KNEE ADMISSION H&P  Patient is being admitted for right total knee arthroplasty.  Subjective:  Chief Complaint:right knee pain.  HPI: Mary Murillo, 70 y.o. female, has a history of pain and functional disability in the right knee due to arthritis and has failed non-surgical conservative treatments for greater than 12 weeks to includeNSAID's and/or analgesics and activity modification.  Onset of symptoms was gradual, starting 10 years ago with gradually worsening course since that time. The patient noted no past surgery on the right knee(s).  Patient currently rates pain in the right knee(s) at 10 out of 10 with activity. Patient has night pain, worsening of pain with activity and weight bearing, pain that interferes with activities of daily living, pain with passive range of motion, crepitus and joint swelling.  Patient has evidence of periarticular osteophytes and joint space narrowing by imaging studies.  There is no active infection.  Risks, benefits and expectations were discussed with the patient.  Risks including but not limited to the risk of anesthesia, blood clots, nerve damage, blood vessel damage, failure of the prosthesis, infection and up to and including death.  Patient understand the risks, benefits and expectations and wishes to proceed with surgery.   D/C Plans:       Home   Post-op Meds:       No Rx given   Tranexamic Acid:      To be given - IV   Decadron:      Is to be given  FYI:      ASA  Norco  Nickel allergy    DME:   Pt equipment arranged  PT:   OPPT @ [facility]  Pharmacy: [facility] [place]    Patient Active Problem List   Diagnosis Date Noted   Insomnia 05/29/2012   History of asthma 05/29/2012   Hypothyroidism 04/30/2011   Hyperlipidemia 04/30/2011   Vitamin D deficiency 04/30/2011   Allergic rhinitis 04/30/2011   Depression 04/30/2011   Past Medical History:  Diagnosis Date   Allergy    hx of seasonal allergies   Anxiety    on meds    Arthritis    osteoarthritis in bilateral knees   Asthma    hx of-no meds after dropping wheat and dairy   Complication of anesthesia    woke up during hysterectomy   Depression    on meds   Hyperlipidemia    Hypothyroidism    on meds   PONV (postoperative nausea and vomiting)    Vitamin D deficiency    on meds    Past Surgical History:  Procedure Laterality Date   BREAST CYST ASPIRATION Left    CESAREAN SECTION  1982   COLONOSCOPY  2011   DB-hems/TICS   TONSILLECTOMY/ADENOIDECTOMY/TURBINATE REDUCTION     VAGINAL HYSTERECTOMY  1991   WISDOM TOOTH EXTRACTION      No current facility-administered medications for this encounter.   Current Outpatient Medications  Medication Sig Dispense Refill Last Dose   CALCIUM PO Take 2 tablets by mouth 2 (two) times daily with a meal.      Camphor-Menthol-Capsicum (TIGER BALM PAIN RELIEVING) 80-24-16 MG PTCH Apply 1 patch topically daily as needed (pain).      Camphor-Menthol-Methyl Sal (TIGER BALM MUSCLE RUB EX) Apply 1 application topically daily as needed (muslce pain).      Cholecalciferol (VITAMIN D3) LIQD Place 4,000 Units under the tongue daily.      CRANBERRY PO Take 1 tablet by mouth at bedtime.  Digestive Enzyme CAPS Take 1 capsule by mouth daily with supper.      escitalopram (LEXAPRO) 20 MG tablet TAKE 1 TABLET BY MOUTH EVERY DAY (Patient taking differently: Take 20 mg by mouth daily.) 90 tablet 3    levothyroxine (SYNTHROID) 50 MCG tablet TAKE 1 TABLET BY MOUTH EVERY DAY BEFORE BREAKFAST (Patient taking differently: Take 50 mcg by mouth at bedtime.) 90 tablet 2    meloxicam (MOBIC) 15 MG tablet TAKE 1 TABLET BY MOUTH EVERY DAY (Patient taking differently: Take 15 mg by mouth daily as needed for pain.) 30 tablet 2    nystatin (MYCOSTATIN) 500000 units TABS tablet Take 500,000 Units by mouth 2 (two) times daily as needed (yeast infections).      OVER THE COUNTER MEDICATION Take 1 tablet by mouth 2 (two)  times daily with a meal. Omegazyme Ultra      OVER THE COUNTER MEDICATION Take 1-2 tablets by mouth See admin instructions. Cholesterol Regulator 2 - Take 1 tablet at breakfast and 2 tablets at dinner      Probiotic Product (PROBIOTIC PO) Take 2 capsules by mouth at bedtime.      Ginkgo Biloba (GINKGO PO) Take 1 Dose by mouth daily.      Omega 3-6-9 Fatty Acids (SUPER OMEGA-3) CAPS Take 1 capsule by mouth 2 (two) times daily with a meal.      Allergies  Allergen Reactions   Lactose Intolerance (Gi) Shortness Of Breath    congestion   Terazol [Terconazole]     Systemic reaction   Wheat Bran Shortness Of Breath    congestion   Nickel    Other Itching and Rash    Stainless steel- SURGICAL STAPLES -     Social History   Tobacco Use   Smoking status: Former Smoker   Smokeless tobacco: Never Used   Tobacco comment: quit in 20's   Substance Use Topics   Alcohol use: Not Currently    Alcohol/week: 4.0 standard drinks    Types: 2 Glasses of wine, 2 Standard drinks or equivalent per week    Comment: occasional at dinner    Family History  Problem Relation Age of Onset   Breast cancer Sister 6   Liver cancer Sister 19       mets from breast   Testicular cancer Father 7   Colon polyps Neg Hx    Colon cancer Neg Hx    Esophageal cancer Neg Hx    Stomach cancer Neg Hx      Review of Systems  Constitutional: Negative.   HENT: Negative.   Eyes: Negative.   Respiratory: Negative.   Cardiovascular: Negative.   Gastrointestinal: Negative.   Genitourinary: Negative.   Musculoskeletal: Positive for joint pain.  Skin: Negative.   Neurological: Negative.   Endo/Heme/Allergies: Positive for environmental allergies.  Psychiatric/Behavioral: Negative.      Objective:  Physical Exam Constitutional:      Appearance: She is well-developed.  HENT:     Head: Normocephalic.  Eyes:     Pupils: Pupils are equal, round, and reactive to light.  Neck:     Thyroid:  No thyromegaly.     Vascular: No JVD.     Trachea: No tracheal deviation.  Cardiovascular:     Rate and Rhythm: Normal rate and regular rhythm.  Pulmonary:     Effort: Pulmonary effort is normal. No respiratory distress.     Breath sounds: Normal breath sounds. No wheezing.  Abdominal:     Palpations: Abdomen  is soft.     Tenderness: There is no abdominal tenderness. There is no guarding.  Musculoskeletal:     Cervical back: Neck supple.     Right knee: Swelling and bony tenderness present. No erythema or ecchymosis. Tenderness present.  Lymphadenopathy:     Cervical: No cervical adenopathy.  Skin:    General: Skin is warm and dry.  Neurological:     Mental Status: She is alert and oriented to person, place, and time.     Sensory: Sensory deficit (occasional numbness in bilateral legs) present.       Labs:   Estimated body mass index is 31.89 kg/m as calculated from the following:   Height as of 01/09/21: 5\' 3"  (1.6 m).   Weight as of 01/09/21: 81.6 kg.   Imaging Review Plain radiographs demonstrate severe degenerative joint disease of the right knee. The bone quality appears to be good for age and reported activity level.      Assessment/Plan:  End stage arthritis, right knee   The patient history, physical examination, clinical judgment of the provider and imaging studies are consistent with end stage degenerative joint disease of the right knee(s) and total knee arthroplasty is deemed medically necessary. The treatment options including medical management, injection therapy arthroscopy and arthroplasty were discussed at length. The risks and benefits of total knee arthroplasty were presented and reviewed. The risks due to aseptic loosening, infection, stiffness, patella tracking problems, thromboembolic complications and other imponderables were discussed. The patient acknowledged the explanation, agreed to proceed with the plan and consent was signed. Patient is being  admitted for treatment for surgery, pain control, PT, OT, prophylactic antibiotics, VTE prophylaxis, progressive ambulation and ADL's and discharge planning. The patient is planning to be discharged home.     Patient's anticipated LOS is less than 2 midnights, meeting these requirements: - Lives within 1 hour of care - Has a competent adult at home to recover with post-op recover - NO history of  - Chronic pain requiring opiods  - Diabetes  - Coronary Artery Disease  - Heart failure  - Heart attack  - Stroke  - DVT/VTE  - Cardiac arrhythmia  - Respiratory Failure/COPD  - Renal failure  - Anemia  - Advanced Liver disease

## 2021-01-15 ENCOUNTER — Ambulatory Visit (HOSPITAL_COMMUNITY): Payer: Medicare PPO | Admitting: Certified Registered Nurse Anesthetist

## 2021-01-15 ENCOUNTER — Other Ambulatory Visit: Payer: Self-pay

## 2021-01-15 ENCOUNTER — Observation Stay (HOSPITAL_COMMUNITY)
Admission: RE | Admit: 2021-01-15 | Discharge: 2021-01-16 | Disposition: A | Discharge status: Home / Self Care | Payer: Medicare PPO | Source: Ambulatory Visit | Attending: Orthopedic Surgery | Admitting: Orthopedic Surgery

## 2021-01-15 ENCOUNTER — Encounter (HOSPITAL_COMMUNITY): Payer: Self-pay | Admitting: Orthopedic Surgery

## 2021-01-15 ENCOUNTER — Encounter (HOSPITAL_COMMUNITY)
Admission: RE | Disposition: A | Discharge status: Home / Self Care | Payer: Self-pay | Source: Ambulatory Visit | Attending: Orthopedic Surgery

## 2021-01-15 DIAGNOSIS — E669 Obesity, unspecified: Secondary | ICD-10-CM | POA: Diagnosis not present

## 2021-01-15 DIAGNOSIS — J45909 Unspecified asthma, uncomplicated: Secondary | ICD-10-CM | POA: Diagnosis not present

## 2021-01-15 DIAGNOSIS — Z87891 Personal history of nicotine dependence: Secondary | ICD-10-CM | POA: Diagnosis not present

## 2021-01-15 DIAGNOSIS — E039 Hypothyroidism, unspecified: Secondary | ICD-10-CM | POA: Diagnosis not present

## 2021-01-15 DIAGNOSIS — M1711 Unilateral primary osteoarthritis, right knee: Principal | ICD-10-CM | POA: Diagnosis present

## 2021-01-15 DIAGNOSIS — Z6831 Body mass index (BMI) 31.0-31.9, adult: Secondary | ICD-10-CM | POA: Diagnosis not present

## 2021-01-15 DIAGNOSIS — G8918 Other acute postprocedural pain: Secondary | ICD-10-CM | POA: Diagnosis not present

## 2021-01-15 DIAGNOSIS — Z79899 Other long term (current) drug therapy: Secondary | ICD-10-CM | POA: Insufficient documentation

## 2021-01-15 DIAGNOSIS — Z96651 Presence of right artificial knee joint: Secondary | ICD-10-CM

## 2021-01-15 DIAGNOSIS — E559 Vitamin D deficiency, unspecified: Secondary | ICD-10-CM | POA: Diagnosis not present

## 2021-01-15 DIAGNOSIS — E785 Hyperlipidemia, unspecified: Secondary | ICD-10-CM | POA: Diagnosis not present

## 2021-01-15 HISTORY — PX: TOTAL KNEE ARTHROPLASTY: SHX125

## 2021-01-15 LAB — TYPE AND SCREEN
ABO/RH(D): O POS
Antibody Screen: NEGATIVE

## 2021-01-15 LAB — ABO/RH: ABO/RH(D): O POS

## 2021-01-15 SURGERY — ARTHROPLASTY, KNEE, TOTAL
Anesthesia: Spinal | Site: Knee | Laterality: Right

## 2021-01-15 MED ORDER — BUPIVACAINE IN DEXTROSE 0.75-8.25 % IT SOLN
INTRATHECAL | Status: DC | PRN
  Administered 2021-01-15: 1.6 mL via INTRATHECAL

## 2021-01-15 MED ORDER — CEFAZOLIN SODIUM-DEXTROSE 2-4 GM/100ML-% IV SOLN: 2.0000 g | INTRAVENOUS | Status: DC

## 2021-01-15 MED ORDER — DIPHENHYDRAMINE HCL 12.5 MG/5ML PO ELIX: 12.5000 mg | ORAL_SOLUTION | ORAL | Status: DC | PRN

## 2021-01-15 MED ORDER — ONDANSETRON HCL 4 MG PO TABS: 4.0000 mg | ORAL_TABLET | Freq: Four times a day (QID) | ORAL | Status: DC | PRN

## 2021-01-15 MED ORDER — HYDROCODONE-ACETAMINOPHEN 7.5-325 MG PO TABS: 1.0000 | ORAL_TABLET | ORAL | Status: DC | PRN

## 2021-01-15 MED ORDER — KETOROLAC TROMETHAMINE 30 MG/ML IJ SOLN
INTRAMUSCULAR | Status: DC | PRN
  Administered 2021-01-15: 30 mg

## 2021-01-15 MED ORDER — PROPOFOL 500 MG/50ML IV EMUL
INTRAVENOUS | Status: DC | PRN
  Administered 2021-01-15: 75 ug/kg/min via INTRAVENOUS

## 2021-01-15 MED ORDER — CEFAZOLIN SODIUM-DEXTROSE 2-4 GM/100ML-% IV SOLN
2.0000 g | Freq: Four times a day (QID) | INTRAVENOUS | Status: AC
  Administered 2021-01-15 (×2): 2 g via INTRAVENOUS
  Filled 2021-01-15 (×2): qty 100

## 2021-01-15 MED ORDER — PROPOFOL 10 MG/ML IV BOLUS
INTRAVENOUS | Status: DC | PRN
  Administered 2021-01-15: 30 mg via INTRAVENOUS
  Administered 2021-01-15: 10 mg via INTRAVENOUS

## 2021-01-15 MED ORDER — CLONIDINE HCL (ANALGESIA) 100 MCG/ML EP SOLN
EPIDURAL | Status: DC | PRN
  Administered 2021-01-15: 100 ug

## 2021-01-15 MED ORDER — ESCITALOPRAM OXALATE 20 MG PO TABS
20.0000 mg | ORAL_TABLET | Freq: Every day | ORAL | Status: DC
  Administered 2021-01-16: 20 mg via ORAL
  Filled 2021-01-15: qty 1

## 2021-01-15 MED ORDER — ACETAMINOPHEN 325 MG PO TABS: 325.0000 mg | ORAL_TABLET | Freq: Four times a day (QID) | ORAL | Status: DC | PRN

## 2021-01-15 MED ORDER — NYSTATIN 500000 UNITS PO TABS: 500000.0000 [IU] | ORAL_TABLET | Freq: Two times a day (BID) | ORAL | Status: DC | PRN

## 2021-01-15 MED ORDER — CEFAZOLIN SODIUM-DEXTROSE 2-4 GM/100ML-% IV SOLN
2.0000 g | INTRAVENOUS | Status: AC
  Administered 2021-01-15: 2 g via INTRAVENOUS
  Filled 2021-01-15: qty 100

## 2021-01-15 MED ORDER — POVIDONE-IODINE 10 % EX SWAB
2.0000 | Freq: Once | CUTANEOUS | Status: DC
Start: 2021-01-15 — End: 2021-01-15

## 2021-01-15 MED ORDER — POLYETHYLENE GLYCOL 3350 17 G PO PACK
17.0000 g | PACK | Freq: Two times a day (BID) | ORAL | Status: DC
  Filled 2021-01-15 (×2): qty 1

## 2021-01-15 MED ORDER — PHENYLEPHRINE HCL-NACL 10-0.9 MG/250ML-% IV SOLN
INTRAVENOUS | Status: DC | PRN
  Administered 2021-01-15: 45 ug/min via INTRAVENOUS

## 2021-01-15 MED ORDER — SODIUM CHLORIDE 0.9 % IR SOLN
Status: DC | PRN
  Administered 2021-01-15: 1000 mL

## 2021-01-15 MED ORDER — CHLORHEXIDINE GLUCONATE 0.12 % MT SOLN
15.0000 mL | Freq: Once | OROMUCOSAL | Status: AC
  Administered 2021-01-15: 15 mL via OROMUCOSAL

## 2021-01-15 MED ORDER — MENTHOL 3 MG MT LOZG: 1.0000 | LOZENGE | OROMUCOSAL | Status: DC | PRN

## 2021-01-15 MED ORDER — LEVOTHYROXINE SODIUM 50 MCG PO TABS
50.0000 ug | ORAL_TABLET | Freq: Every day | ORAL | Status: DC
  Administered 2021-01-15: 50 ug via ORAL
  Filled 2021-01-15: qty 1

## 2021-01-15 MED ORDER — ONDANSETRON HCL 4 MG/2ML IJ SOLN: 4.0000 mg | Freq: Four times a day (QID) | INTRAMUSCULAR | Status: DC | PRN

## 2021-01-15 MED ORDER — BUPIVACAINE-EPINEPHRINE (PF) 0.25% -1:200000 IJ SOLN
INTRAMUSCULAR | Status: AC
  Filled 2021-01-15: qty 30

## 2021-01-15 MED ORDER — POVIDONE-IODINE 10 % EX SWAB
2.0000 "application " | Freq: Once | CUTANEOUS | Status: AC
  Administered 2021-01-15: 2 via TOPICAL

## 2021-01-15 MED ORDER — METHOCARBAMOL 500 MG IVPB - SIMPLE MED
500.0000 mg | Freq: Four times a day (QID) | INTRAVENOUS | Status: DC | PRN
  Filled 2021-01-15: qty 50

## 2021-01-15 MED ORDER — DEXAMETHASONE SODIUM PHOSPHATE 10 MG/ML IJ SOLN
10.0000 mg | Freq: Once | INTRAMUSCULAR | Status: AC
  Administered 2021-01-16: 10 mg via INTRAVENOUS
  Filled 2021-01-15: qty 1

## 2021-01-15 MED ORDER — STERILE WATER FOR IRRIGATION IR SOLN
Status: DC | PRN
  Administered 2021-01-15: 2000 mL

## 2021-01-15 MED ORDER — FENTANYL CITRATE (PF) 100 MCG/2ML IJ SOLN: 25.0000 ug | INTRAMUSCULAR | Status: DC | PRN

## 2021-01-15 MED ORDER — VANCOMYCIN HCL 1000 MG IV SOLR
INTRAVENOUS | Status: AC
  Filled 2021-01-15: qty 1000

## 2021-01-15 MED ORDER — ACETAMINOPHEN 500 MG PO TABS
1000.0000 mg | ORAL_TABLET | Freq: Once | ORAL | Status: AC
  Administered 2021-01-15: 1000 mg via ORAL
  Filled 2021-01-15: qty 2

## 2021-01-15 MED ORDER — SODIUM CHLORIDE (PF) 0.9 % IJ SOLN
INTRAMUSCULAR | Status: AC
  Filled 2021-01-15: qty 50

## 2021-01-15 MED ORDER — OXYCODONE HCL 5 MG/5ML PO SOLN: 5.0000 mg | Freq: Once | ORAL | Status: DC | PRN

## 2021-01-15 MED ORDER — KETOROLAC TROMETHAMINE 30 MG/ML IJ SOLN
INTRAMUSCULAR | Status: AC
  Filled 2021-01-15: qty 1

## 2021-01-15 MED ORDER — CELECOXIB 200 MG PO CAPS
200.0000 mg | ORAL_CAPSULE | Freq: Two times a day (BID) | ORAL | Status: DC
  Administered 2021-01-15 – 2021-01-16 (×2): 200 mg via ORAL
  Filled 2021-01-15 (×2): qty 1

## 2021-01-15 MED ORDER — LACTATED RINGERS IV SOLN: INTRAVENOUS | Status: DC

## 2021-01-15 MED ORDER — TOBRAMYCIN SULFATE 1.2 G IJ SOLR
INTRAMUSCULAR | Status: AC
  Filled 2021-01-15: qty 1.2

## 2021-01-15 MED ORDER — MIDAZOLAM HCL 2 MG/2ML IJ SOLN
1.0000 mg | INTRAMUSCULAR | Status: DC
  Administered 2021-01-15: 1 mg via INTRAVENOUS
  Filled 2021-01-15: qty 2

## 2021-01-15 MED ORDER — ALUM & MAG HYDROXIDE-SIMETH 200-200-20 MG/5ML PO SUSP: 15.0000 mL | ORAL | Status: DC | PRN

## 2021-01-15 MED ORDER — PROMETHAZINE HCL 25 MG/ML IJ SOLN: 6.2500 mg | INTRAMUSCULAR | Status: DC | PRN

## 2021-01-15 MED ORDER — HYDROCODONE-ACETAMINOPHEN 5-325 MG PO TABS
1.0000 | ORAL_TABLET | ORAL | Status: DC | PRN
  Administered 2021-01-16: 2 via ORAL
  Administered 2021-01-16: 1 via ORAL
  Administered 2021-01-16: 2 via ORAL
  Filled 2021-01-15: qty 2
  Filled 2021-01-15: qty 1
  Filled 2021-01-15: qty 2

## 2021-01-15 MED ORDER — BUPIVACAINE-EPINEPHRINE (PF) 0.5% -1:200000 IJ SOLN
INTRAMUSCULAR | Status: DC | PRN
  Administered 2021-01-15: 15 mL via PERINEURAL

## 2021-01-15 MED ORDER — DEXAMETHASONE SODIUM PHOSPHATE 10 MG/ML IJ SOLN: 10.0000 mg | Freq: Once | INTRAMUSCULAR | Status: DC

## 2021-01-15 MED ORDER — FERROUS SULFATE 325 (65 FE) MG PO TABS
325.0000 mg | ORAL_TABLET | Freq: Two times a day (BID) | ORAL | Status: DC
  Administered 2021-01-15 – 2021-01-16 (×2): 325 mg via ORAL
  Filled 2021-01-15 (×2): qty 1

## 2021-01-15 MED ORDER — TRANEXAMIC ACID-NACL 1000-0.7 MG/100ML-% IV SOLN
1000.0000 mg | Freq: Once | INTRAVENOUS | Status: AC
  Administered 2021-01-15: 1000 mg via INTRAVENOUS
  Filled 2021-01-15: qty 100

## 2021-01-15 MED ORDER — BISACODYL 10 MG RE SUPP: 10.0000 mg | Freq: Every day | RECTAL | Status: DC | PRN

## 2021-01-15 MED ORDER — OXYCODONE HCL 5 MG PO TABS: 5.0000 mg | ORAL_TABLET | Freq: Once | ORAL | Status: DC | PRN

## 2021-01-15 MED ORDER — FENTANYL CITRATE (PF) 100 MCG/2ML IJ SOLN
50.0000 ug | Freq: Once | INTRAMUSCULAR | Status: AC
  Administered 2021-01-15: 50 ug via INTRAVENOUS
  Filled 2021-01-15: qty 2

## 2021-01-15 MED ORDER — PROPOFOL 500 MG/50ML IV EMUL
INTRAVENOUS | Status: AC
  Filled 2021-01-15: qty 50

## 2021-01-15 MED ORDER — ORAL CARE MOUTH RINSE: 15.0000 mL | Freq: Once | OROMUCOSAL | Status: AC

## 2021-01-15 MED ORDER — BUPIVACAINE-EPINEPHRINE (PF) 0.25% -1:200000 IJ SOLN
INTRAMUSCULAR | Status: DC | PRN
  Administered 2021-01-15: 30 mL

## 2021-01-15 MED ORDER — ONDANSETRON HCL 4 MG/2ML IJ SOLN
INTRAMUSCULAR | Status: DC | PRN
  Administered 2021-01-15: 4 mg via INTRAVENOUS

## 2021-01-15 MED ORDER — TRANEXAMIC ACID-NACL 1000-0.7 MG/100ML-% IV SOLN
1000.0000 mg | INTRAVENOUS | Status: AC
  Administered 2021-01-15: 1000 mg via INTRAVENOUS
  Filled 2021-01-15: qty 100

## 2021-01-15 MED ORDER — PHENYLEPHRINE 40 MCG/ML (10ML) SYRINGE FOR IV PUSH (FOR BLOOD PRESSURE SUPPORT)
PREFILLED_SYRINGE | INTRAVENOUS | Status: DC | PRN
  Administered 2021-01-15: 120 ug via INTRAVENOUS

## 2021-01-15 MED ORDER — METHOCARBAMOL 500 MG PO TABS
500.0000 mg | ORAL_TABLET | Freq: Four times a day (QID) | ORAL | Status: DC | PRN
  Administered 2021-01-16: 500 mg via ORAL
  Filled 2021-01-15: qty 1

## 2021-01-15 MED ORDER — ASPIRIN 81 MG PO CHEW
81.0000 mg | CHEWABLE_TABLET | Freq: Two times a day (BID) | ORAL | Status: DC
  Administered 2021-01-15 – 2021-01-16 (×2): 81 mg via ORAL
  Filled 2021-01-15 (×2): qty 1

## 2021-01-15 MED ORDER — METOCLOPRAMIDE HCL 5 MG/ML IJ SOLN: 5.0000 mg | Freq: Three times a day (TID) | INTRAMUSCULAR | Status: DC | PRN

## 2021-01-15 MED ORDER — HYDROMORPHONE HCL 1 MG/ML IJ SOLN: 0.5000 mg | INTRAMUSCULAR | Status: DC | PRN

## 2021-01-15 MED ORDER — PROPOFOL 10 MG/ML IV BOLUS
INTRAVENOUS | Status: AC
  Filled 2021-01-15: qty 20

## 2021-01-15 MED ORDER — SODIUM CHLORIDE (PF) 0.9 % IJ SOLN
INTRAMUSCULAR | Status: DC | PRN
  Administered 2021-01-15: 30 mL

## 2021-01-15 MED ORDER — PHENOL 1.4 % MT LIQD: 1.0000 | OROMUCOSAL | Status: DC | PRN

## 2021-01-15 MED ORDER — ONDANSETRON HCL 4 MG/2ML IJ SOLN
INTRAMUSCULAR | Status: AC
  Filled 2021-01-15: qty 2

## 2021-01-15 MED ORDER — DEXAMETHASONE SODIUM PHOSPHATE 10 MG/ML IJ SOLN
INTRAMUSCULAR | Status: AC
  Filled 2021-01-15: qty 1

## 2021-01-15 MED ORDER — METOCLOPRAMIDE HCL 5 MG PO TABS: 5.0000 mg | ORAL_TABLET | Freq: Three times a day (TID) | ORAL | Status: DC | PRN

## 2021-01-15 MED ORDER — DEXAMETHASONE SODIUM PHOSPHATE 4 MG/ML IJ SOLN
INTRAMUSCULAR | Status: DC | PRN
  Administered 2021-01-15: 8 mg via INTRAVENOUS

## 2021-01-15 MED ORDER — MAGNESIUM CITRATE PO SOLN: 1.0000 | Freq: Once | ORAL | Status: DC | PRN

## 2021-01-15 MED ORDER — TRANEXAMIC ACID-NACL 1000-0.7 MG/100ML-% IV SOLN: 1000.0000 mg | INTRAVENOUS | Status: DC

## 2021-01-15 MED ORDER — SODIUM CHLORIDE 0.9 % IV SOLN: INTRAVENOUS | Status: DC

## 2021-01-15 MED ORDER — DOCUSATE SODIUM 100 MG PO CAPS
100.0000 mg | ORAL_CAPSULE | Freq: Two times a day (BID) | ORAL | Status: DC
  Administered 2021-01-15 – 2021-01-16 (×2): 100 mg via ORAL
  Filled 2021-01-15 (×2): qty 1

## 2021-01-15 MED ORDER — 0.9 % SODIUM CHLORIDE (POUR BTL) OPTIME
TOPICAL | Status: DC | PRN
  Administered 2021-01-15: 1000 mL

## 2021-01-15 SURGICAL SUPPLY — 61 items
ADH SKN CLS APL DERMABOND .7 (GAUZE/BANDAGES/DRESSINGS) ×1
BAG SPEC THK2 15X12 ZIP CLS (MISCELLANEOUS)
BAG ZIPLOCK 12X15 (MISCELLANEOUS) IMPLANT
BLADE SAW SGTL 11.0X1.19X90.0M (BLADE) ×1 IMPLANT
BLADE SAW SGTL 13.0X1.19X90.0M (BLADE) ×2 IMPLANT
BLADE SURG SZ10 CARB STEEL (BLADE) ×4 IMPLANT
BNDG CMPR MED 10X6 ELC LF (GAUZE/BANDAGES/DRESSINGS) ×1
BNDG ELASTIC 6X10 VLCR STRL LF (GAUZE/BANDAGES/DRESSINGS) ×1 IMPLANT
BNDG ELASTIC 6X5.8 VLCR STR LF (GAUZE/BANDAGES/DRESSINGS) ×2 IMPLANT
BOWL SMART MIX CTS (DISPOSABLE) ×2 IMPLANT
BSPLAT TIB 5D E CMNT KN RT (Knees) ×1 IMPLANT
CEMENT HV SMART SET (Cement) ×2 IMPLANT
COMP FEMUR CR CMNTD RT SZ6 NRW (Miscellaneous) ×2 IMPLANT
COMPONENT FEMRL CMNTDRTSZ6NRW (Miscellaneous) IMPLANT
COVER WAND RF STERILE (DRAPES) ×2 IMPLANT
CUFF TOURN SGL QUICK 34 (TOURNIQUET CUFF) ×2
CUFF TRNQT CYL 34X4.125X (TOURNIQUET CUFF) ×1 IMPLANT
DECANTER SPIKE VIAL GLASS SM (MISCELLANEOUS) ×4 IMPLANT
DERMABOND ADVANCED (GAUZE/BANDAGES/DRESSINGS) ×1
DERMABOND ADVANCED .7 DNX12 (GAUZE/BANDAGES/DRESSINGS) ×1 IMPLANT
DRAPE U-SHAPE 47X51 STRL (DRAPES) ×2 IMPLANT
DRESSING AQUACEL AG SP 3.5X10 (GAUZE/BANDAGES/DRESSINGS) ×1 IMPLANT
DRSG AQUACEL AG SP 3.5X10 (GAUZE/BANDAGES/DRESSINGS) ×2
DURAPREP 26ML APPLICATOR (WOUND CARE) ×4 IMPLANT
ELECT REM PT RETURN 15FT ADLT (MISCELLANEOUS) ×2 IMPLANT
GLOVE ORTHO TXT STRL SZ7.5 (GLOVE) ×2 IMPLANT
GLOVE SURG ENC MOIS LTX SZ6 (GLOVE) IMPLANT
GLOVE SURG LTX SZ8 (GLOVE) ×4 IMPLANT
GLOVE SURG UNDER POLY LF SZ6.5 (GLOVE) IMPLANT
GLOVE SURG UNDER POLY LF SZ7.5 (GLOVE) ×3 IMPLANT
GLOVE SURG UNDER POLY LF SZ8.5 (GLOVE) ×2 IMPLANT
GOWN STRL REUS W/TWL 2XL LVL3 (GOWN DISPOSABLE) ×2 IMPLANT
GOWN STRL REUS W/TWL LRG LVL3 (GOWN DISPOSABLE) ×2 IMPLANT
HANDPIECE INTERPULSE COAX TIP (DISPOSABLE) ×2
HOLDER FOLEY CATH W/STRAP (MISCELLANEOUS) ×2 IMPLANT
INSERT TIB ARTISURF SZ6-7 R 11 (Joint) ×1 IMPLANT
KIT TURNOVER KIT A (KITS) ×2 IMPLANT
MANIFOLD NEPTUNE II (INSTRUMENTS) ×2 IMPLANT
NDL SAFETY ECLIPSE 18X1.5 (NEEDLE) IMPLANT
NEEDLE HYPO 18GX1.5 SHARP (NEEDLE)
NS IRRIG 1000ML POUR BTL (IV SOLUTION) ×2 IMPLANT
PACK TOTAL KNEE CUSTOM (KITS) ×2 IMPLANT
PENCIL SMOKE EVACUATOR (MISCELLANEOUS) ×1 IMPLANT
PROTECTOR NERVE ULNAR (MISCELLANEOUS) ×2 IMPLANT
Persona The Personalized knee system Femur Narrow (Knees) ×1 IMPLANT
SCREW FEMALE HEX FIX 25X2.5 (ORTHOPEDIC DISPOSABLE SUPPLIES) ×1 IMPLANT
SET HNDPC FAN SPRY TIP SCT (DISPOSABLE) ×1 IMPLANT
SET PAD KNEE POSITIONER (MISCELLANEOUS) ×2 IMPLANT
STEM POLY PAT PLY 35M KNEE (Knees) ×2 IMPLANT
STEM TIBIA 5 DEG SZ E R KNEE (Knees) IMPLANT
SUT MNCRL AB 4-0 PS2 18 (SUTURE) ×2 IMPLANT
SUT STRATAFIX PDS+ 0 24IN (SUTURE) ×2 IMPLANT
SUT VIC AB 1 CT1 36 (SUTURE) ×2 IMPLANT
SUT VIC AB 2-0 CT1 27 (SUTURE) ×6
SUT VIC AB 2-0 CT1 TAPERPNT 27 (SUTURE) ×3 IMPLANT
SYR 3ML LL SCALE MARK (SYRINGE) ×2 IMPLANT
TIBIA STEM 5 DEG SZ E R KNEE (Knees) ×2 IMPLANT
TRAY FOLEY MTR SLVR 16FR STAT (SET/KITS/TRAYS/PACK) ×1 IMPLANT
TUBE SUCTION HIGH CAP CLEAR NV (SUCTIONS) ×2 IMPLANT
WATER STERILE IRR 1000ML POUR (IV SOLUTION) ×4 IMPLANT
WRAP KNEE MAXI GEL POST OP (GAUZE/BANDAGES/DRESSINGS) ×2 IMPLANT

## 2021-01-15 NOTE — Interval H&P Note (Signed)
History and Physical Interval Note:  01/15/2021 10:00 AM  Mary Murillo  has presented today for surgery, with the diagnosis of Right knee osteoarthritis.  The various methods of treatment have been discussed with the patient and family. After consideration of risks, benefits and other options for treatment, the patient has consented to  Procedure(s) with comments: TOTAL KNEE ARTHROPLASTY (Right) - 70 mins *Has a Nickel Allergy! as a surgical intervention.  The patient's history has been reviewed, patient examined, no change in status, stable for surgery.  I have reviewed the patient's chart and labs.  Questions were answered to the patient's satisfaction.     Mauri Pole

## 2021-01-15 NOTE — Anesthesia Preprocedure Evaluation (Addendum)
Anesthesia Evaluation  Patient identified by MRN, date of birth, ID band Patient awake    Reviewed: Allergy & Precautions, NPO status , Patient's Chart, lab work & pertinent test results  History of Anesthesia Complications Negative for: history of anesthetic complications  Airway Mallampati: II  TM Distance: >3 FB Neck ROM: Full    Dental no notable dental hx.    Pulmonary asthma , former smoker,    Pulmonary exam normal        Cardiovascular negative cardio ROS Normal cardiovascular exam     Neuro/Psych Anxiety Depression negative neurological ROS     GI/Hepatic negative GI ROS, Neg liver ROS,   Endo/Other  Hypothyroidism   Renal/GU negative Renal ROS  negative genitourinary   Musculoskeletal  (+) Arthritis ,   Abdominal   Peds  Hematology negative hematology ROS (+)   Anesthesia Other Findings Day of surgery medications reviewed with patient.  Reproductive/Obstetrics negative OB ROS                            Anesthesia Physical Anesthesia Plan  ASA: II  Anesthesia Plan: Spinal   Post-op Pain Management:  Regional for Post-op pain   Induction:   PONV Risk Score and Plan: 3 and Treatment may vary due to age or medical condition, Midazolam, Ondansetron, Dexamethasone and Propofol infusion  Airway Management Planned: Natural Airway and Simple Face Mask  Additional Equipment: None  Intra-op Plan:   Post-operative Plan:   Informed Consent: I have reviewed the patients History and Physical, chart, labs and discussed the procedure including the risks, benefits and alternatives for the proposed anesthesia with the patient or authorized representative who has indicated his/her understanding and acceptance.       Plan Discussed with: CRNA  Anesthesia Plan Comments:        Anesthesia Quick Evaluation

## 2021-01-15 NOTE — Care Plan (Signed)
Ortho Bundle Case Management Note  Patient Details  Name: Mary Murillo MRN: [id number] Date of Birth: 03-May-1951  R TKA on 01-15-21 DCP:  Home with husband.  2 story home with 3-4 ste. DME:  RW and 3-in-1 ordered through [place] PT:  [facility].  PT eval scheduled on 01-18-21.                   DME Arranged:  Gilford Rile rolling,3-N-1 DME Agency:  [facility]  HH Arranged:  NA [place] Agency:  NA  Additional Comments: Please contact me with any questions of if this plan should need to change.  Marianne Sofia, RN,CCM [facility]  [phone] 01/15/2021, 12:47 PM

## 2021-01-15 NOTE — Brief Op Note (Signed)
01/15/2021  1:53 PM  PATIENT:  Mary Murillo  70 y.o. female  PRE-OPERATIVE DIAGNOSIS:  Right knee osteoarthritis  POST-OPERATIVE DIAGNOSIS:  Right knee osteoarthritis  PROCEDURE:  Procedure(s) with comments: TOTAL KNEE ARTHROPLASTY (Right) - 70 mins *Has a Nickel Allergy!  SURGEON:  Surgeon(s) and Role:    * Paralee Cancel, MD - Primary  PHYSICIAN ASSISTANT: Danae Orleans, PA-C  ANESTHESIA:   regional and spinal  EBL:  25 mL   BLOOD ADMINISTERED:none  DRAINS: none   LOCAL MEDICATIONS USED:  MARCAINE     SPECIMEN:  No Specimen  DISPOSITION OF SPECIMEN:  N/A  COUNTS:  YES  TOURNIQUET:   Total Tourniquet Time Documented: Thigh (Right) - 36 minutes Total: Thigh (Right) - 36 minutes   DICTATION: .Other Dictation: Dictation Number [id number]  PLAN OF CARE: Admit for overnight observation  PATIENT DISPOSITION:  PACU - hemodynamically stable.   Delay start of Pharmacological VTE agent (>24hrs) due to surgical blood loss or risk of bleeding: no

## 2021-01-15 NOTE — Progress Notes (Signed)
Assisted Dr. Howze with right, ultrasound guided, adductor canal block. Side rails up, monitors on throughout procedure. See vital signs in flow sheet. Tolerated Procedure well.  

## 2021-01-15 NOTE — Discharge Instructions (Signed)

## 2021-01-15 NOTE — Transfer of Care (Signed)
Immediate Anesthesia Transfer of Care Note  Patient: Mary Murillo  Procedure(s) Performed: TOTAL KNEE ARTHROPLASTY (Right Knee)  Patient Location: PACU  Anesthesia Type:Spinal  Level of Consciousness: awake, alert  and oriented  Airway & Oxygen Therapy: Patient Spontanous Breathing and Patient connected to face mask  Post-op Assessment: Report given to RN and Post -op Vital signs reviewed and stable  Post vital signs: Reviewed and stable  Last Vitals:  Vitals Value Taken Time  BP    Temp    Pulse    Resp    SpO2      Last Pain:  Vitals:   01/15/21 0923  TempSrc: Oral  PainSc:          Complications: No complications documented.

## 2021-01-15 NOTE — Anesthesia Postprocedure Evaluation (Signed)
Anesthesia Post Note  Patient: Mary Murillo  Procedure(s) Performed: TOTAL KNEE ARTHROPLASTY (Right Knee)     Patient location during evaluation: PACU Anesthesia Type: Spinal Level of consciousness: awake and alert and oriented Pain management: pain level controlled Vital Signs Assessment: post-procedure vital signs reviewed and stable Respiratory status: spontaneous breathing, nonlabored ventilation and respiratory function stable Cardiovascular status: blood pressure returned to baseline Postop Assessment: no apparent nausea or vomiting, spinal receding, no headache and no backache Anesthetic complications: no   No complications documented.  Last Vitals:  Vitals:   01/15/21 1500 01/15/21 1515  BP: 108/60 107/71  Pulse: 77 75  Resp: 17 16  Temp:    SpO2: 96% 97%    Last Pain:  Vitals:   01/15/21 1515  TempSrc:   PainSc: 0-No pain                 Brennan Bailey

## 2021-01-15 NOTE — Anesthesia Procedure Notes (Signed)
Anesthesia Regional Block: Adductor canal block   Pre-Anesthetic Checklist: ,, timeout performed, Correct Patient, Correct Site, Correct Laterality, Correct Procedure, Correct Position, site marked, Risks and benefits discussed, pre-op evaluation,  At surgeon's request and post-op pain management  Laterality: Right  Prep: Maximum Sterile Barrier Precautions used, chloraprep       Needles:  Injection technique: Single-shot  Needle Type: Echogenic Stimulator Needle     Needle Length: 9cm  Needle Gauge: 22     Additional Needles:   Procedures:,,,, ultrasound used (permanent image in chart),,,,  Narrative:  Start time: 01/15/2021 10:38 AM End time: 01/15/2021 10:40 AM Injection made incrementally with aspirations every 5 mL.  Performed by: Personally  Anesthesiologist: Brennan Bailey, MD  Additional Notes: Risks, benefits, and alternative discussed. Patient gave consent for procedure. Patient prepped and draped in sterile fashion. Sedation administered, patient remains easily responsive to voice. Relevant anatomy identified with ultrasound guidance. Local anesthetic given in 5cc increments with no signs or symptoms of intravascular injection. No pain or paraesthesias with injection. Patient monitored throughout procedure with signs of LAST or immediate complications. Tolerated well. Ultrasound image placed in chart.  Tawny Asal, MD

## 2021-01-15 NOTE — Anesthesia Procedure Notes (Signed)
Spinal  Patient location during procedure: OR Staffing Performed: resident/CRNA  Resident/CRNA: Rosaland Lao, CRNA Preanesthetic Checklist Completed: patient identified, IV checked, site marked, risks and benefits discussed, surgical consent, monitors and equipment checked, pre-op evaluation and timeout performed Spinal Block Patient position: sitting Prep: DuraPrep Patient monitoring: heart rate, cardiac monitor, continuous pulse ox and blood pressure Approach: midline Location: L3-4 Injection technique: single-shot Needle Needle type: Pencan  Needle gauge: 24 G Needle length: 10 cm Assessment Sensory level: T4

## 2021-01-15 NOTE — Op Note (Signed)
NAME: Mary Murillo, EBLEN MEDICAL RECORD NO: [id number] ACCOUNT NO: [id number] DATE OF BIRTH: 10/01/1951 FACILITY: [facility] LOCATION: [facility] PHYSICIAN: Pietro Cassis. Alvan Dame, MD  Operative Report   DATE OF PROCEDURE: 01/15/2021  PREOPERATIVE DIAGNOSIS:  Right knee advanced osteoarthritis and pain.  POSTOPERATIVE DIAGNOSIS:  Right knee advanced osteoarthritis and pain.  PROCEDURE:  Right total knee arthroplasty.  COMPONENTS USED:  [employer]immer Persona knee replacement with a size 6 narrow nitrided cruciate retaining femoral component with a size E stemmed natural tibial tray from Persona with a size 11 mm highly cross-linked poly, medial congruent to match the 6 femur  and the 35 patellar button.  SURGEON:  Pietro Cassis. Alvan Dame, MD  ASSISTANT:  Danae Orleans, PA-C.  Note that Mr. Guinevere Scarlet was present for the entirety of the case for preoperative positioning, perioperative management of the operative extremity, general facilitation of the case and primary wound closure.  ANESTHESIA:  Regional by Spinal.  ESTIMATED BLOOD LOSS:  Less than 50 mL.  TOURNIQUET:  Up for total of 36 minutes at 250 mmHg.  INDICATIONS:  The patient is a very pleasant 70 year old female who has been dealing with bilateral knee osteoarthritis over the years.  She has noted progressive worsening of her symptoms.  It began to affect her quality of life and functional ability.   She states she finally developed enough courage to have it evaluated for surgical consideration.  We had a lengthy discussion regarding the surgery and her concerns.  She did note to [place] that she had a nickel allergy as well as some antibiotic  sensitivity.  After reviewing standard risk of infection, DVT, component failure, stiffness and need for future surgeries as well as stressing the postoperative course and effort required to maximize her functional return, she elected to proceed with  knee replacement in a staged fashion.  Consent was obtained for benefit of  pain relief.  DESCRIPTION OF PROCEDURE:  The patient was brought to the operative theater.  Once adequate anesthesia, preoperative antibiotics, Ancef administered she was positioned supine with a right thigh tourniquet.  Please note that it was around this time that  the Zimmer representative had noticed that the implants that were brought were for the left knee and not the right knee.  We did have to wait about an hour for the right sided femoral components to arrive.  The right leg had been prepped and draped, but  was kept in a neutral comfortable position while she was maintained under propofol anesthetic.  Once we had a green light that the components were available, we performed a timeout, identifying the patient, planned procedure, and extremity.  The right  lower extremity was then exsanguinated and the tourniquet elevated to 250 mmHg.  Midline incision was made followed by soft tissue exposure.  A median arthrotomy was then made.  Initially, we encountered severe arthritic changes on the femoral trochlea  and patella.  Following initial exposure, attention was directed to the patella.  Precut measurement of the patella was noted to be 22 mm.  I resected down to 14 mm and used a 35 patellar button.  Lug holes were drilled and a metal shim was placed to  protect the patellofemoral retractors and saw blades.  Following this, the attention was directed to the femur.  The femoral canal was opened with a drill.  Intramedullary rod was passed and at 5 degrees of valgus, 10 mm of bone was resected off the  distal femur.  Following this resection, the PCL was  preserved.  The tibia subluxed anteriorly.  Using extramedullary guide, I resected a 2 mm cut off the proximal lateral tibia, the most affected side.  Following this resection, we determined that the  extension gap would be stable with at least a 10 mm insert.  I then sized the femur to be a size 6.  The rotation was pinned off the posterior condylar  axis.  The size 6 cutting block was then tamped into position.  After the initial cut, I decided I  needed to move it posteriorly 2 mm.  Appropriate measures were taken to do this.  Once this was completed, the anterior, posterior and chamfer cuts were all made without difficulty, nor notching.  The trial size 6 femur was impacted on the distal femur.   I determined that I was going to use a narrow component based off this.  We then subluxated the tibia anteriorly.  The cut surface of the tibia was best fit with a size E tibial tray.  It was pinned into place, drilled and keel punched.  At this point,  we did a trial reduction and determined that an 11 mm insert allowed the knee to come to full extension.  The patella tracked through the trochlea without application of pressure, given these findings, we removed all the trial components.  The final  components were opened on the back table.  While this was going on and the cement was mixed the knee was irrigated with normal saline and solution with pulse lavage.  We injected the knee with 0.25% Marcaine with epinephrine and Toradol.  Once the cut  surfaces were cleaned and dried, the final components were cemented into place and the knee brought to extension with an 11 mm trial insert.  Once the cement had fully cured, excess cement was removed throughout the knee.  The final 11 mm insert to match  the 6 femur was opened and then snapped into the tibial tray.  The knee was re-irrigated.  The tourniquet had been let down after 36 minutes without significant hemostasis required.  The knee was then closed in layers with #1 Vicryl and #1 Stratafix  suture on the extensor mechanism followed by 2-0 Vicryl and a running Monocryl.  The knee was then cleaned, dried and dressed sterilely using surgical glue and Aquacel dressing.  The patient was brought to the recovery room in stable condition, tolerated  the procedure well.   PUS D: 01/15/2021 2:01:35 pm T:  01/15/2021 3:24:00 pm  JOB: [id number]/ [id number]

## 2021-01-16 DIAGNOSIS — M1711 Unilateral primary osteoarthritis, right knee: Secondary | ICD-10-CM | POA: Diagnosis not present

## 2021-01-16 DIAGNOSIS — E039 Hypothyroidism, unspecified: Secondary | ICD-10-CM | POA: Diagnosis not present

## 2021-01-16 DIAGNOSIS — Z79899 Other long term (current) drug therapy: Secondary | ICD-10-CM | POA: Diagnosis not present

## 2021-01-16 DIAGNOSIS — Z87891 Personal history of nicotine dependence: Secondary | ICD-10-CM | POA: Diagnosis not present

## 2021-01-16 DIAGNOSIS — E669 Obesity, unspecified: Secondary | ICD-10-CM | POA: Diagnosis present

## 2021-01-16 DIAGNOSIS — Z6831 Body mass index (BMI) 31.0-31.9, adult: Secondary | ICD-10-CM | POA: Diagnosis not present

## 2021-01-16 DIAGNOSIS — J45909 Unspecified asthma, uncomplicated: Secondary | ICD-10-CM | POA: Diagnosis not present

## 2021-01-16 DIAGNOSIS — M25561 Pain in right knee: Secondary | ICD-10-CM | POA: Diagnosis not present

## 2021-01-16 DIAGNOSIS — Z96651 Presence of right artificial knee joint: Secondary | ICD-10-CM | POA: Diagnosis not present

## 2021-01-16 LAB — CBC
HCT: 36.7 % (ref 36.0–46.0)
Hemoglobin: 12.1 g/dL (ref 12.0–15.0)
MCH: 31.2 pg (ref 26.0–34.0)
MCHC: 33 g/dL (ref 30.0–36.0)
MCV: 94.6 fL (ref 80.0–100.0)
Platelets: 310 10*3/uL (ref 150–400)
RBC: 3.88 MIL/uL (ref 3.87–5.11)
RDW: 13.6 % (ref 11.5–15.5)
WBC: 14.5 10*3/uL — ABNORMAL HIGH (ref 4.0–10.5)
nRBC: 0 % (ref 0.0–0.2)

## 2021-01-16 LAB — BASIC METABOLIC PANEL
Anion gap: 8 (ref 5–15)
BUN: 6 mg/dL — ABNORMAL LOW (ref 8–23)
CO2: 22 mmol/L (ref 22–32)
Calcium: 9 mg/dL (ref 8.9–10.3)
Chloride: 111 mmol/L (ref 98–111)
Creatinine, Ser: 0.75 mg/dL (ref 0.44–1.00)
GFR, Estimated: >60 mL/min (ref >60)
Glucose, Bld: 132 mg/dL — ABNORMAL HIGH (ref 70–99)
Potassium: 3.9 mmol/L (ref 3.5–5.1)
Sodium: 141 mmol/L (ref 135–145)

## 2021-01-16 MED ORDER — ASPIRIN 81 MG PO CHEW: 81.0000 mg | CHEWABLE_TABLET | Freq: Two times a day (BID) | ORAL | 0 refills | Status: AC

## 2021-01-16 MED ORDER — HYDROCODONE-ACETAMINOPHEN 7.5-325 MG PO TABS: 1.0000 | ORAL_TABLET | ORAL | 0 refills | Status: DC | PRN

## 2021-01-16 MED ORDER — CHLORHEXIDINE GLUCONATE CLOTH 2 % EX PADS: 6.0000 | MEDICATED_PAD | Freq: Every day | CUTANEOUS | Status: DC

## 2021-01-16 MED ORDER — METHOCARBAMOL 500 MG PO TABS: 500.0000 mg | ORAL_TABLET | Freq: Four times a day (QID) | ORAL | 0 refills | Status: DC | PRN

## 2021-01-16 MED ORDER — FERROUS SULFATE 325 (65 FE) MG PO TABS: 325.0000 mg | ORAL_TABLET | Freq: Three times a day (TID) | ORAL | 0 refills | Status: DC

## 2021-01-16 MED ORDER — CELECOXIB 200 MG PO CAPS: 200.0000 mg | ORAL_CAPSULE | Freq: Two times a day (BID) | ORAL | 0 refills | Status: DC

## 2021-01-16 MED ORDER — DOCUSATE SODIUM 100 MG PO CAPS: 100.0000 mg | ORAL_CAPSULE | Freq: Two times a day (BID) | ORAL | 0 refills | Status: DC

## 2021-01-16 MED ORDER — POLYETHYLENE GLYCOL 3350 17 G PO PACK: 17.0000 g | PACK | Freq: Two times a day (BID) | ORAL | 0 refills | Status: DC

## 2021-01-16 NOTE — TOC Transition Note (Signed)
Transition of Care [facility] - CM/SW Discharge Note  Patient Details  Name: Mary Murillo MRN: [id number] Date of Birth: 06/15/1951  [facility] of [facility]) CM/SW Contact:  Sherie Don, LCSW Phone Number: 01/16/2021, 10:56 AM  Clinical Narrative: Patient to discharge home with OPPT through [facility]. Patient is an ortho bundle; DME (walker, 3N1) arranged prior to surgery and delivered by [employer]. TOC signing off.  Final next level of care: OP Rehab Barriers to Discharge: Barriers Resolved  Patient Goals and CMS Choice Patient states their goals for this hospitalization and ongoing recovery are:: Discharge home CMS Medicare.gov Compare Post Acute Care list provided to:: Patient Choice offered to / list presented to : Patient  Discharge Plan and Services         DME Arranged: 3-N-1,Walker rolling DME Agency: [employer] HH Arranged: NA [place] Agency: NA  Readmission Risk Interventions No flowsheet data found.

## 2021-01-16 NOTE — Progress Notes (Signed)
     Subjective: 1 Day Post-Op Procedure(s) (LRB): TOTAL KNEE ARTHROPLASTY (Right)   Seen by Dr. Alvan Dame. Patient reports pain as mild, pain controlled. No reported events throughout the night.  Dr. Alvan Dame discussed the procedure, findings and expectations moving forward.  Ready to be discharged home, if they do well with PT.  Follow up in the clinic in 2 weeks.  Knows to call with any questions or concerns.     Patient's anticipated LOS is less than 2 midnights, meeting these requirements: - Lives within 1 hour of care - Has a competent adult at home to recover with post-op recover - NO history of  - Chronic pain requiring opiods  - Diabetes  - Coronary Artery Disease  - Heart failure  - Heart attack  - Stroke  - DVT/VTE  - Cardiac arrhythmia  - Respiratory Failure/COPD  - Renal failure  - Anemia  - Advanced Liver disease       Objective:   VITALS:   Vitals:   01/16/21 0103 01/16/21 0600  BP: 128/78 116/67  Pulse: 72 74  Resp: 16 16  Temp: 97.7 F (36.5 C) 97.8 F (36.6 C)  SpO2: 94% 99%    Dorsiflexion/Plantar flexion intact Incision: dressing C/D/I No cellulitis present Compartment soft  LABS Recent Labs    01/16/21 0321  HGB 12.1  HCT 36.7  WBC 14.5*  PLT 310    Recent Labs    01/16/21 0321  NA 141  K 3.9  BUN 6*  CREATININE 0.75  GLUCOSE 132*     Assessment/Plan: 1 Day Post-Op Procedure(s) (LRB): TOTAL KNEE ARTHROPLASTY (Right) Foley cath d/c'ed Advance diet Up with therapy D/C IV fluids Discharge home  Follow up in 2 weeks at [facility] Follow up with OLIN,Kaylanni Ezelle D in 2 weeks.  Contact information:  [facility] [address] [place] [phone]      Obese (BMI 30-39.9) Estimated body mass index is 31.89 kg/m as calculated from the following:   Height as of this encounter: 5\' 3"  (1.6 m).   Weight as of this encounter: 81.6 kg. Patient also counseled that weight may inhibit the  healing process Patient counseled that losing weight will help with future health issues       Danae Orleans PA-C  [facility]  [place] [address], [place], [place] [address] Phone: [phone] [email or web address] Facebook  [employer]

## 2021-01-16 NOTE — Progress Notes (Signed)
Physical Therapy Treatment Patient Details Name: Mary Murillo MRN: [id number] DOB: [birth year] Today's Date: 01/16/2021    History of Present Illness Pt is a 70 year old female s/p right TKA    PT Comments    Pt ambulated in hallway and performed stairs again.  Pt also provided with HEP handout and verbally reviewed exercises.  Pt also demonstrated a couple exercises to ensure understanding.  Pt and spouse report understanding and had no further questions.  Pt feels ready to d/c home today.   Follow Up Recommendations  Follow surgeon's recommendation for DC plan and follow-up therapies     Equipment Recommendations  Rolling walker with 5" wheels;3in1 (PT)    Recommendations for Other Services       Precautions / Restrictions Precautions Precautions: Fall;Knee Restrictions RLE Weight Bearing: Weight bearing as tolerated    Mobility  Bed Mobility               General bed mobility comments: pt in recliner on arrival    Transfers Overall transfer level: Needs assistance Equipment used: Rolling walker (2 wheeled) Transfers: Sit to/from Stand Sit to Stand: Min guard         General transfer comment: verbal cues for hand placement and R LE forward for pain control  Ambulation/Gait Ambulation/Gait assistance: Min guard Gait Distance (Feet): 200 Feet Assistive device: Rolling walker (2 wheeled) Gait Pattern/deviations: Step-to pattern;Antalgic;Decreased stance time - right     General Gait Details: verbal cues for sequence, RW positioning, heel strike, allowing knee flexion with swing   Stairs Stairs: Yes Stairs assistance: Min guard Stair Management: Forwards;With walker;Two rails Number of Stairs: 3 General stair comments: verbal cues for safety and sequence; performed twice   Wheelchair Mobility    Modified Rankin (Stroke Patients Only)       Balance                                            Cognition Arousal/Alertness:  Awake/alert Behavior During Therapy: WFL for tasks assessed/performed Overall Cognitive Status: Within Functional Limits for tasks assessed                                        Exercises Total Joint Exercises Ankle Circles/Pumps: AROM;Both;10 reps Long Arc Quad: AROM;Right;Seated;10 reps Knee Flexion: AROM;Right;10 reps;Seated    General Comments        Pertinent Vitals/Pain Pain Assessment: 0-10 Pain Score: 4  Pain Descriptors / Indicators: Aching;Sore;Tightness Pain Intervention(s): Repositioned;Monitored during session    Home Living Family/patient expects to be discharged to:: Private residence Living Arrangements: Spouse/significant other   Type of Home: House     Home Layout: Two level Home Equipment: None      Prior Function Level of Independence: Independent          PT Goals (current goals can now be found in the care plan section) Acute Rehab PT Goals Patient Stated Goal: home today PT Goal Formulation: With patient/family Time For Goal Achievement: 01/19/21 Potential to Achieve Goals: Good Progress towards PT goals: Progressing toward goals    Frequency    7X/week      PT Plan Current plan remains appropriate    Co-evaluation              AM-PAC PT "  6 Clicks" Mobility   Outcome Measure  Help needed turning from your back to your side while in a flat bed without using bedrails?: A Little Help needed moving from lying on your back to sitting on the side of a flat bed without using bedrails?: A Little Help needed moving to and from a bed to a chair (including a wheelchair)?: A Little Help needed standing up from a chair using your arms (e.g., wheelchair or bedside chair)?: A Little Help needed to walk in hospital room?: A Little Help needed climbing 3-5 steps with a railing? : A Little 6 Click Score: 18    End of Session Equipment Utilized During Treatment: Gait belt Activity Tolerance: Patient tolerated treatment  well Patient left: in chair;with call bell/phone within reach;with family/visitor present Nurse Communication: Mobility status PT Visit Diagnosis: Difficulty in walking, not elsewhere classified (R26.2)     Time: 7741-2878 PT Time Calculation (min) (ACUTE ONLY): 24 min  Charges:  $Gait Training: 8-22 mins $Therapeutic Exercise: 8-22 mins                    Jannette Spanner PT, DPT Acute Rehabilitation Services Pager: [phone] Office: [phone]  York Ram E 01/16/2021, 3:23 PM

## 2021-01-16 NOTE — Discharge Summary (Signed)
Patient ID: [name] MRN: [id number] DOB/AGE: July 21, 1951 70 y.o.  Admit date: 01/15/2021 Discharge date: 01/16/2021  Admission Diagnoses:  Principal Problem:   Osteoarthritis of right knee Active Problems:   Status post total right knee replacement   Obese   Discharge Diagnoses:  Same  Past Medical History:  Diagnosis Date  . Allergy    hx of seasonal allergies  . Anxiety    on meds  . Arthritis    osteoarthritis in bilateral knees  . Asthma    hx of-no meds after dropping wheat and dairy  . Depression    on meds  . Hyperlipidemia   . Hypothyroidism    on meds  . PONV (postoperative nausea and vomiting)    vomiting x1 s/p hysterectomy in 1990s  . Vitamin D deficiency    on meds    Surgeries: Procedure(s): RIGHT TOTAL KNEE ARTHROPLASTY on 01/15/2021   Consultants: N/A  Discharged Condition: Improved  Hospital Course: Mary Murillo is an 70 y.o. female who was admitted 01/15/2021 for operative treatment of Osteoarthritis of right knee. Patient has severe unremitting pain that affects sleep, daily activities, and work/hobbies. After pre-op clearance the patient was taken to the operating room on 01/15/2021 and underwent  Procedure(s): RIGHT TOTAL KNEE ARTHROPLASTY.    Patient was given perioperative antibiotics:  Anti-infectives (From admission, onward)   Start     Dose/Rate Route Frequency Ordered Stop   01/16/21 0600  ceFAZolin (ANCEF) IVPB 2g/100 mL premix  Status:  Discontinued        2 g 200 mL/hr over 30 Minutes Intravenous On call to O.R. 01/15/21 1715 01/15/21 1719   01/15/21 1630  ceFAZolin (ANCEF) IVPB 2g/100 mL premix        2 g 200 mL/hr over 30 Minutes Intravenous Every 6 hours 01/15/21 1618 01/15/21 2200   01/15/21 1618  nystatin (MYCOSTATIN) tablet 500,000 Units  Status:  Discontinued        500,000 Units Oral 2 times daily PRN 01/15/21 1618 01/15/21 1634   01/15/21 0915  ceFAZolin (ANCEF) IVPB 2g/100 mL premix        2 g 200 mL/hr over 30 Minutes  Intravenous On call to O.R. 01/15/21 0908 01/15/21 1058       Patient was given sequential compression devices, early ambulation, and chemoprophylaxis to prevent DVT.  Patient benefited maximally from hospital stay and there were no complications.    Recent vital signs:  Patient Vitals for the past 24 hrs:  BP Temp Temp src Pulse Resp SpO2 Height Weight  01/16/21 0600 116/67 97.8 F (36.6 C) Oral 74 16 99 % -- --  01/16/21 0103 128/78 97.7 F (36.5 C) Oral 72 16 94 % -- --  01/15/21 2212 119/71 97.6 F (36.4 C) Oral 76 16 99 % -- --  01/15/21 1842 107/63 97.6 F (36.4 C) Oral 81 19 97 % -- --  01/15/21 1625 98/64 -- -- 67 15 97 % -- --  01/15/21 1600 103/63 -- -- 74 17 96 % -- --  01/15/21 1545 104/66 -- -- 77 13 96 % -- --  01/15/21 1530 111/67 -- -- 78 18 96 % -- --  01/15/21 1515 107/71 -- -- 75 16 97 % -- --  01/15/21 1500 108/60 -- -- 77 17 96 % -- --  01/15/21 1445 102/63 -- -- 76 17 97 % -- --  01/15/21 1430 (!) 90/59 -- -- 80 19 94 % -- --  01/15/21 1421 98/62 97.7 F (  36.5 C) -- 80 14 94 % -- --  01/15/21 1042 127/72 -- -- 78 10 99 % -- --  01/15/21 1041 -- -- -- 81 17 100 % -- --  01/15/21 1040 -- -- -- 80 19 99 % -- --  01/15/21 1039 135/79 -- -- 78 12 99 % -- --  01/15/21 1038 -- -- -- 81 (!) 24 100 % -- --  01/15/21 1037 -- -- -- 82 17 100 % -- --  01/15/21 1036 -- -- -- 79 14 99 % -- --  01/15/21 1035 -- -- -- 79 (!) 22 100 % -- --  01/15/21 1034 (!) 151/90 -- -- 79 15 100 % -- --  01/15/21 0923 (!) 147/87 99 F (37.2 C) Oral 95 16 97 % -- --  01/15/21 0921 -- -- -- -- -- -- 5\' 3"  (1.6 m) 81.6 kg     Recent laboratory studies:  Recent Labs    01/16/21 0321  WBC 14.5*  HGB 12.1  HCT 36.7  PLT 310  NA 141  K 3.9  CL 111  CO2 22  BUN 6*  CREATININE 0.75  GLUCOSE 132*  CALCIUM 9.0     Discharge Medications:   Allergies as of 01/16/2021      Reactions   Lactose Intolerance (gi) Shortness Of Breath   congestion   Terazol [terconazole]     Systemic reaction   Wheat Bran Shortness Of Breath   congestion   Nickel    Other Itching, Rash   Stainless steel- SURGICAL STAPLES -       Medication List    STOP taking these medications   meloxicam 15 MG tablet Commonly known as: MOBIC     TAKE these medications   aspirin 81 MG chewable tablet Commonly known as: Aspirin Childrens Chew 1 tablet (81 mg total) by mouth 2 (two) times daily. Take for 4 weeks, then resume regular dose.   CALCIUM PO Take 2 tablets by mouth 2 (two) times daily with a meal.   celecoxib 200 MG capsule Commonly known as: CeleBREX Take 1 capsule (200 mg total) by mouth 2 (two) times daily.   CRANBERRY PO Take 1 tablet by mouth at bedtime.   Digestive Enzyme Caps Take 1 capsule by mouth daily with supper.   docusate sodium 100 MG capsule Commonly known as: Colace Take 1 capsule (100 mg total) by mouth 2 (two) times daily.   escitalopram 20 MG tablet Commonly known as: LEXAPRO TAKE 1 TABLET BY MOUTH EVERY DAY   ferrous sulfate 325 (65 FE) MG tablet Commonly known as: FerrouSul Take 1 tablet (325 mg total) by mouth 3 (three) times daily with meals for 14 days.   GINKGO PO Take 1 Dose by mouth daily.   HYDROcodone-acetaminophen 7.5-325 MG tablet Commonly known as: Norco Take 1-2 tablets by mouth every 4 (four) hours as needed for moderate pain.   levothyroxine 50 MCG tablet Commonly known as: SYNTHROID TAKE 1 TABLET BY MOUTH EVERY DAY BEFORE BREAKFAST What changed: See the new instructions.   methocarbamol 500 MG tablet Commonly known as: Robaxin Take 1 tablet (500 mg total) by mouth every 6 (six) hours as needed for muscle spasms.   nystatin 500000 units Tabs tablet Commonly known as: MYCOSTATIN Take 500,000 Units by mouth 2 (two) times daily as needed (yeast infections).   OVER THE COUNTER MEDICATION Take 1 tablet by mouth 2 (two) times daily with a meal. Omegazyme Ultra   OVER THE COUNTER MEDICATION Take  1-2 tablets by mouth  See admin instructions. Cholesterol Regulator 2 - Take 1 tablet at breakfast and 2 tablets at dinner   polyethylene glycol 17 g packet Commonly known as: MIRALAX / GLYCOLAX Take 17 g by mouth 2 (two) times daily.   PROBIOTIC PO Take 2 capsules by mouth at bedtime.   Super Omega-3 Caps Take 1 capsule by mouth 2 (two) times daily with a meal.   TIGER BALM MUSCLE RUB EX Apply 1 application topically daily as needed (muslce pain).   Tiger Balm Pain Relieving 80-24-16 MG Ptch Generic drug: Camphor-Menthol-Capsicum Apply 1 patch topically daily as needed (pain).   Vitamin D3 Liqd Place 4,000 Units under the tongue daily.            Discharge Care Instructions  (From admission, onward)         Start     Ordered   01/16/21 0000  Change dressing       Comments: Maintain surgical dressing until follow up in the clinic. If the edges start to pull up, may reinforce with tape. If the dressing is no longer working, may remove and cover with gauze and tape, but must keep the area dry and clean.  Call with any questions or concerns.   01/16/21 0829          Diagnostic Studies: No results found.  Disposition: Home  Discharge Instructions    Call MD / Call 911   Complete by: As directed    If you experience chest pain or shortness of breath, CALL 911 and be transported to the hospital emergency room.  If you develope a fever above 101 F, pus (white drainage) or increased drainage or redness at the wound, or calf pain, call your surgeon's office.   Change dressing   Complete by: As directed    Maintain surgical dressing until follow up in the clinic. If the edges start to pull up, may reinforce with tape. If the dressing is no longer working, may remove and cover with gauze and tape, but must keep the area dry and clean.  Call with any questions or concerns.   Constipation Prevention   Complete by: As directed    Drink plenty of fluids.  Prune juice may be helpful.  You may use a  stool softener, such as Colace (over the counter) 100 mg twice a day.  Use MiraLax (over the counter) for constipation as needed.   Diet - low sodium heart healthy   Complete by: As directed    Discharge instructions   Complete by: As directed    Maintain surgical dressing until follow up in the clinic. If the edges start to pull up, may reinforce with tape. If the dressing is no longer working, may remove and cover with gauze and tape, but must keep the area dry and clean.  Follow up in 2 weeks at [facility]. Call with any questions or concerns.   Increase activity slowly as tolerated   Complete by: As directed    Weight bearing as tolerated with assist device (walker, cane, etc) as directed, use it as long as suggested by your surgeon or therapist, typically at least 4-6 weeks.   TED hose   Complete by: As directed    Use stockings (TED hose) for 2 weeks on both leg(s).  You may remove them at night for sleeping.       Follow-up Information    Paralee Cancel, MD. Go on 01/30/2021.   Specialty: Orthopedic  Surgery Why: You are scheduled for a post-operative appointment on 01-30-21 at 8:00 am Contact information: [address] [place] [address] [phone]        Rosilyn Mings.. Go on 01/18/2021.   Why: You are scheduled for a physical therapy appointment on 01-18-21 at 8:00 am.  Contact information: [place] [address] [phone]                Signed: Lucille Passy [facility] 01/16/2021, 8:30 AM

## 2021-01-16 NOTE — Evaluation (Signed)
Physical Therapy Evaluation Patient Details Name: Mary Murillo MRN: [id number] DOB: Sep 29, 1951 Today's Date: 01/16/2021   History of Present Illness  Pt is a 70 year old female s/p right TKA  Clinical Impression  Pt is s/p TKA resulting in the deficits listed below (see PT Problem List).  Pt will benefit from skilled PT to increase their independence and safety with mobility to allow discharge to the venue listed below.  Pt ambulated to stairs and required seated rest break prior to performing stairs and then again after.  With second rest break, pt performing a couple exercises and reported feeling faint so assisted to supine on mat table (where she was currently sitting).  BP 114/61 mmHg and HR 68 bpm once dynamap obtained.  Pt reported feeling better after a few minutes of lying supine and wished to ambulate back to room.  Recliner following for safety upon returning to room.      Follow Up Recommendations Follow surgeon's recommendation for DC plan and follow-up therapies    Equipment Recommendations  Rolling walker with 5" wheels;3in1 (PT)    Recommendations for Other Services       Precautions / Restrictions Precautions Precautions: Fall;Knee Restrictions Weight Bearing Restrictions: Yes RLE Weight Bearing: Weight bearing as tolerated      Mobility  Bed Mobility               General bed mobility comments: pt in bathroom on arrival    Transfers Overall transfer level: Needs assistance Equipment used: Rolling walker (2 wheeled) Transfers: Sit to/from Stand Sit to Stand: Min guard         General transfer comment: verbal cues for hand placement and R LE forward for pain control  Ambulation/Gait Ambulation/Gait assistance: Min guard Gait Distance (Feet): 160 Feet Assistive device: Rolling walker (2 wheeled) Gait Pattern/deviations: Step-to pattern;Antalgic;Decreased stance time - right     General Gait Details: verbal cues for sequence, RW positioning, heel  strike, allowing knee flexion with swing  Stairs Stairs: Yes Stairs assistance: Min guard Stair Management: Forwards;Backwards;One rail Right;One rail Left;With walker Number of Stairs: 3 General stair comments: verbal cues for safety and sequence; pt performed once with right rail only and then again with left rail only; pt descended backwards as she has been doing this prior to surgery; spouse present and educated on gait belt and positioning for safety assisting pt  Wheelchair Mobility    Modified Rankin (Stroke Patients Only)       Balance                                             Pertinent Vitals/Pain Pain Assessment: 0-10 Pain Score: 5  Pain Descriptors / Indicators: Aching;Sore;Tightness Pain Intervention(s): Monitored during session;Repositioned;Ice applied    Home Living Family/patient expects to be discharged to:: Private residence Living Arrangements: Spouse/significant other   Type of Home: House       Home Layout: Two level Home Equipment: None      Prior Function Level of Independence: Independent               Hand Dominance        Extremity/Trunk Assessment        Lower Extremity Assessment Lower Extremity Assessment: RLE deficits/detail RLE Deficits / Details: active R knee ROM -8-85*, able to perform ankle pumps       Communication  Communication: No difficulties  Cognition Arousal/Alertness: Awake/alert Behavior During Therapy: WFL for tasks assessed/performed Overall Cognitive Status: Within Functional Limits for tasks assessed                                        General Comments      Exercises Total Joint Exercises Ankle Circles/Pumps: AROM;Both;10 reps Long Arc Quad: AROM;Right;Seated;10 reps Knee Flexion: AROM;Right;10 reps;Seated   Assessment/Plan    PT Assessment Patient needs continued PT services  PT Problem List Decreased strength;Decreased mobility;Decreased  balance;Decreased knowledge of use of DME;Pain;Decreased range of motion       PT Treatment Interventions Stair training;Gait training;Balance training;DME instruction;Therapeutic exercise;Functional mobility training;Therapeutic activities;Patient/family education    PT Goals (Current goals can be found in the Care Plan section)  Acute Rehab PT Goals Patient Stated Goal: home today PT Goal Formulation: With patient/family Time For Goal Achievement: 01/19/21 Potential to Achieve Goals: Good    Frequency 7X/week   Barriers to discharge        Co-evaluation               AM-PAC PT "6 Clicks" Mobility  Outcome Measure Help needed turning from your back to your side while in a flat bed without using bedrails?: A Little Help needed moving from lying on your back to sitting on the side of a flat bed without using bedrails?: A Little Help needed moving to and from a bed to a chair (including a wheelchair)?: A Little Help needed standing up from a chair using your arms (e.g., wheelchair or bedside chair)?: A Little Help needed to walk in hospital room?: A Little Help needed climbing 3-5 steps with a railing? : A Little 6 Click Score: 18    End of Session Equipment Utilized During Treatment: Gait belt Activity Tolerance: Patient tolerated treatment well Patient left: in chair;with call bell/phone within reach;with chair alarm set;with family/visitor present Nurse Communication: Mobility status PT Visit Diagnosis: Difficulty in walking, not elsewhere classified (R26.2)    Time: 0998-3382 PT Time Calculation (min) (ACUTE ONLY): 35 min   Charges:   PT Evaluation $PT Eval Low Complexity: 1 Low PT Treatments $Gait Training: 8-22 mins       Jannette Spanner PT, DPT Acute Rehabilitation Services Pager: [phone] Office: [phone]  York Ram E 01/16/2021, 12:32 PM

## 2021-01-16 NOTE — Plan of Care (Signed)
Patient discharged home in stable condition 

## 2021-01-18 DIAGNOSIS — M25561 Pain in right knee: Secondary | ICD-10-CM | POA: Diagnosis not present

## 2021-01-21 ENCOUNTER — Encounter (HOSPITAL_COMMUNITY): Payer: Self-pay | Admitting: Orthopedic Surgery

## 2021-01-21 DIAGNOSIS — M25561 Pain in right knee: Secondary | ICD-10-CM | POA: Diagnosis not present

## 2021-01-22 ENCOUNTER — Telehealth: Payer: Self-pay | Admitting: Gastroenterology

## 2021-01-22 MED ORDER — HYDROCORTISONE ACETATE 25 MG RE SUPP: 25.0000 mg | Freq: Every day | RECTAL | 3 refills | Status: DC

## 2021-01-22 NOTE — Telephone Encounter (Signed)
I received a message from Dr. Adriana Mccallum that Mary Murillo was having some hemorrhoid issues after a bought of constipation that occurred knee surgery 01/15/21. Review of records show that Dr. Olevia Perches prescribed  ANUSOL-HC 25 MG  SUPP (HYDROCORTISONE ACETATE) one at bedtime  #12 x 3 in the past.  Please offer her this prescription and an office visit with me or an APP if needed. Thanks.

## 2021-01-22 NOTE — Addendum Note (Signed)
Addended by: Rosanne Sack R on: 01/22/2021 09:18 AM   Modules accepted: Orders

## 2021-01-22 NOTE — Telephone Encounter (Signed)
Spoke with pt and she is aware, script sent to pharmacy. Pt states she will call back if she needs to be seen.

## 2021-01-23 DIAGNOSIS — M25561 Pain in right knee: Secondary | ICD-10-CM | POA: Diagnosis not present

## 2021-01-24 DIAGNOSIS — M25561 Pain in right knee: Secondary | ICD-10-CM | POA: Diagnosis not present

## 2021-01-28 DIAGNOSIS — M25561 Pain in right knee: Secondary | ICD-10-CM | POA: Diagnosis not present

## 2021-01-30 DIAGNOSIS — M25561 Pain in right knee: Secondary | ICD-10-CM | POA: Diagnosis not present

## 2021-02-01 DIAGNOSIS — M25561 Pain in right knee: Secondary | ICD-10-CM | POA: Diagnosis not present

## 2021-02-05 DIAGNOSIS — M25561 Pain in right knee: Secondary | ICD-10-CM | POA: Diagnosis not present

## 2021-02-07 DIAGNOSIS — M25561 Pain in right knee: Secondary | ICD-10-CM | POA: Diagnosis not present

## 2021-02-12 DIAGNOSIS — M25561 Pain in right knee: Secondary | ICD-10-CM | POA: Diagnosis not present

## 2021-02-14 DIAGNOSIS — M25561 Pain in right knee: Secondary | ICD-10-CM | POA: Diagnosis not present

## 2021-02-19 ENCOUNTER — Other Ambulatory Visit (HOSPITAL_COMMUNITY): Payer: Medicare PPO

## 2021-02-19 DIAGNOSIS — M25561 Pain in right knee: Secondary | ICD-10-CM | POA: Diagnosis not present

## 2021-02-21 DIAGNOSIS — M25561 Pain in right knee: Secondary | ICD-10-CM | POA: Diagnosis not present

## 2021-02-25 DIAGNOSIS — M25561 Pain in right knee: Secondary | ICD-10-CM | POA: Diagnosis not present

## 2021-02-27 DIAGNOSIS — Z96651 Presence of right artificial knee joint: Secondary | ICD-10-CM | POA: Diagnosis not present

## 2021-02-27 DIAGNOSIS — Z471 Aftercare following joint replacement surgery: Secondary | ICD-10-CM | POA: Diagnosis not present

## 2021-02-28 DIAGNOSIS — M25561 Pain in right knee: Secondary | ICD-10-CM | POA: Diagnosis not present

## 2021-03-11 DIAGNOSIS — D225 Melanocytic nevi of trunk: Secondary | ICD-10-CM | POA: Diagnosis not present

## 2021-03-11 DIAGNOSIS — D1801 Hemangioma of skin and subcutaneous tissue: Secondary | ICD-10-CM | POA: Diagnosis not present

## 2021-03-11 DIAGNOSIS — D2271 Melanocytic nevi of right lower limb, including hip: Secondary | ICD-10-CM | POA: Diagnosis not present

## 2021-03-11 DIAGNOSIS — L821 Other seborrheic keratosis: Secondary | ICD-10-CM | POA: Diagnosis not present

## 2021-03-14 DIAGNOSIS — H2513 Age-related nuclear cataract, bilateral: Secondary | ICD-10-CM | POA: Diagnosis not present

## 2021-03-14 DIAGNOSIS — H524 Presbyopia: Secondary | ICD-10-CM | POA: Diagnosis not present

## 2021-03-14 DIAGNOSIS — H5203 Hypermetropia, bilateral: Secondary | ICD-10-CM | POA: Diagnosis not present

## 2021-03-14 DIAGNOSIS — H52223 Regular astigmatism, bilateral: Secondary | ICD-10-CM | POA: Diagnosis not present

## 2021-04-11 NOTE — Progress Notes (Signed)
Pt. Needs orders for upcomming procedure.PAT and labs on 04/11/21.

## 2021-04-11 NOTE — Patient Instructions (Signed)
DUE TO COVID-19 ONLY ONE VISITOR IS ALLOWED TO COME WITH YOU AND STAY IN THE WAITING ROOM ONLY DURING PRE OP AND PROCEDURE DAY OF SURGERY. THE 1 VISITOR  MAY VISIT WITH YOU AFTER SURGERY IN YOUR PRIVATE ROOM DURING VISITING HOURS ONLY!  YOU NEED TO HAVE A COVID 19 TEST ON: 04/12/21@ 9:50 AM. THIS TEST MUST BE DONE BEFORE SURGERY,  COVID TESTING SITE [place] [address], IT IS ON THE RIGHT GOING OUT [address] APPROXIMATELY  2 MINUTES PAST [employer] ON THE RIGHT. ONCE YOUR COVID TEST IS COMPLETED,  PLEASE BEGIN THE QUARANTINE INSTRUCTIONS AS OUTLINED IN YOUR HANDOUT.                Mary Murillo   Your procedure is scheduled on: 04/16/21   Report to [facility] Main  Entrance   Report to short stay at: 5: 15 AM     Call this number if you have problems the morning of surgery [phone]    Remember: Do not eat food or drink liquids :After Midnight.   BRUSH YOUR TEETH MORNING OF SURGERY AND RINSE YOUR MOUTH OUT, NO CHEWING GUM CANDY OR MINTS.    Take these medicines the morning of surgery with A SIP OF WATER: escitalopram,levothyroxine.                               You may not have any metal on your body including hair pins and              piercings  Do not wear jewelry, make-up, lotions, powders or perfumes, deodorant             Do not wear nail polish on your fingernails.  Do not shave  48 hours prior to surgery.    Do not bring valuables to the hospital. [place].  Contacts, dentures or bridgework may not be worn into surgery.  Leave suitcase in the car. After surgery it may be brought to your room.     Patients discharged the day of surgery will not be allowed to drive home. IF YOU ARE HAVING SURGERY AND GOING HOME THE SAME DAY, YOU MUST HAVE AN ADULT TO DRIVE YOU HOME AND BE WITH YOU FOR 24 HOURS. YOU MAY GO HOME BY TAXI OR UBER OR ORTHERWISE, BUT AN ADULT MUST ACCOMPANY YOU HOME AND  STAY WITH YOU FOR 24 HOURS.  Name and phone number of your driver:  Special Instructions: N/A              Please read over the following fact sheets you were given: _____________________________________________________________________           [facility] - Preparing for Surgery Before surgery, you can play an important role.  Because skin is not sterile, your skin needs to be as free of germs as possible.  You can reduce the number of germs on your skin by washing with CHG (chlorahexidine gluconate) soap before surgery.  CHG is an antiseptic cleaner which kills germs and bonds with the skin to continue killing germs even after washing. Please DO NOT use if you have an allergy to CHG or antibacterial soaps.  If your skin becomes reddened/irritated stop using the CHG and inform your nurse when you arrive at Short Stay. Do  not shave (including legs and underarms) for at least 48 hours prior to the first CHG shower.  You may shave your face/neck. Please follow these instructions carefully:  1.  Shower with CHG Soap the night before surgery and the  morning of Surgery.  2.  If you choose to wash your hair, wash your hair first as usual with your  normal  shampoo.  3.  After you shampoo, rinse your hair and body thoroughly to remove the  shampoo.                           4.  Use CHG as you would any other liquid soap.  You can apply chg directly  to the skin and wash                       Gently with a scrungie or clean washcloth.  5.  Apply the CHG Soap to your body ONLY FROM THE NECK DOWN.   Do not use on face/ open                           Wound or open sores. Avoid contact with eyes, ears mouth and genitals (private parts).                       Wash face,  Genitals (private parts) with your normal soap.             6.  Wash thoroughly, paying special attention to the area where your surgery  will be performed.  7.  Thoroughly rinse your body with warm water from the neck down.  8.  DO NOT  shower/wash with your normal soap after using and rinsing off  the CHG Soap.                9.  Pat yourself dry with a clean towel.            10.  Wear clean pajamas.            11.  Place clean sheets on your bed the night of your first shower and do not  sleep with pets. Day of Surgery : Do not apply any lotions/deodorants the morning of surgery.  Please wear clean clothes to the hospital/surgery center.  FAILURE TO FOLLOW THESE INSTRUCTIONS MAY RESULT IN THE CANCELLATION OF YOUR SURGERY PATIENT SIGNATURE_________________________________  NURSE SIGNATURE__________________________________  ________________________________________________________________________

## 2021-04-12 ENCOUNTER — Other Ambulatory Visit (HOSPITAL_COMMUNITY)
Admission: RE | Admit: 2021-04-12 | Discharge: 2021-04-12 | Disposition: A | Discharge status: Home / Self Care | Payer: Medicare PPO | Source: Ambulatory Visit | Attending: Orthopedic Surgery | Admitting: Orthopedic Surgery

## 2021-04-12 ENCOUNTER — Encounter (HOSPITAL_COMMUNITY): Payer: Self-pay

## 2021-04-12 ENCOUNTER — Encounter (HOSPITAL_COMMUNITY)
Admission: RE | Admit: 2021-04-12 | Discharge: 2021-04-12 | Disposition: A | Discharge status: Home / Self Care | Payer: Medicare PPO | Source: Ambulatory Visit | Attending: Orthopedic Surgery | Admitting: Orthopedic Surgery

## 2021-04-12 ENCOUNTER — Other Ambulatory Visit: Payer: Self-pay

## 2021-04-12 DIAGNOSIS — Z20822 Contact with and (suspected) exposure to covid-19: Secondary | ICD-10-CM | POA: Diagnosis not present

## 2021-04-12 DIAGNOSIS — Z01812 Encounter for preprocedural laboratory examination: Secondary | ICD-10-CM | POA: Diagnosis not present

## 2021-04-12 LAB — SURGICAL PCR SCREEN
MRSA, PCR: NEGATIVE
Staphylococcus aureus: POSITIVE — AB

## 2021-04-12 LAB — CBC
HCT: 42.5 % (ref 36.0–46.0)
Hemoglobin: 14 g/dL (ref 12.0–15.0)
MCH: 30.4 pg (ref 26.0–34.0)
MCHC: 32.9 g/dL (ref 30.0–36.0)
MCV: 92.4 fL (ref 80.0–100.0)
Platelets: 365 10*3/uL (ref 150–400)
RBC: 4.6 MIL/uL (ref 3.87–5.11)
RDW: 13.1 % (ref 11.5–15.5)
WBC: 5.7 10*3/uL (ref 4.0–10.5)
nRBC: 0 % (ref 0.0–0.2)

## 2021-04-12 LAB — SARS CORONAVIRUS 2 (TAT 6-24 HRS): SARS Coronavirus 2: NEGATIVE

## 2021-04-12 NOTE — Progress Notes (Signed)
COVID Vaccine Completed: Yes Date COVID Vaccine completed: 07/25/20 COVID vaccine manufacturer: Pfizer      PCP - Dr. Tedra Sen[place] Cardiologist -   Chest x-ray -  EKG -  Stress Test -  ECHO -  Cardiac Cath -  Pacemaker/ICD device last checked:  Sleep Study -  CPAP -   Fasting Blood Sugar -  Checks Blood Sugar _____ times a day  Blood Thinner Instructions: Aspirin Instructions: Last Dose:  Anesthesia review:   Patient denies shortness of breath, fever, cough and chest pain at PAT appointment   Patient verbalized understanding of instructions that were given to them at the PAT appointment. Patient was also instructed that they will need to review over the PAT instructions again at home before surgery.

## 2021-04-12 NOTE — Progress Notes (Signed)
PCR results sent to Dr. Alvan Dame.

## 2021-04-15 NOTE — H&P (Signed)
TOTAL KNEE ADMISSION H&P  Patient is being admitted for left total knee arthroplasty.  Subjective:  Chief Complaint:left knee pain.  HPI: Mary Murillo, 70 y.o. female, has a history of pain and functional disability in the left knee due to arthritis and has failed non-surgical conservative treatments for greater than 12 weeks to includeNSAID's and/or analgesics and activity modification.  Onset of symptoms was gradual, starting 2 years ago with gradually worsening course since that time. The patient noted no past surgery on the left knee(s).  Patient currently rates pain in the left knee(s) at 7 out of 10 with activity. Patient has worsening of pain with activity and weight bearing and pain that interferes with activities of daily living.  Patient has evidence of joint space narrowing by imaging studies. . There is no active infection.  Patient Active Problem List   Diagnosis Date Noted   Obese 01/16/2021   Osteoarthritis of right knee 01/15/2021   Status post total right knee replacement 01/15/2021   Insomnia 05/29/2012   History of asthma 05/29/2012   Hypothyroidism 04/30/2011   Hyperlipidemia 04/30/2011   Vitamin D deficiency 04/30/2011   Allergic rhinitis 04/30/2011   Depression 04/30/2011   Past Medical History:  Diagnosis Date   Allergy    hx of seasonal allergies   Anxiety    on meds   Arthritis    osteoarthritis in bilateral knees   Asthma    hx of-no meds after dropping wheat and dairy   Depression    on meds   Hyperlipidemia    Hypothyroidism    on meds   PONV (postoperative nausea and vomiting)    vomiting x1 s/p hysterectomy in 1990s   Vitamin D deficiency    on meds    Past Surgical History:  Procedure Laterality Date   BREAST CYST ASPIRATION Left    [place]   COLONOSCOPY  2011   DB-hems/TICS   TONSILLECTOMY/ADENOIDECTOMY/TURBINATE REDUCTION     TOTAL KNEE ARTHROPLASTY Right 01/15/2021   Procedure: TOTAL KNEE ARTHROPLASTY;  Surgeon: Paralee Cancel, MD;  Location: [facility];  Service: Orthopedics;  Laterality: Right;  70 mins *Has a Nickel Allergy!   VAGINAL HYSTERECTOMY  1991   WISDOM TOOTH EXTRACTION      No current facility-administered medications for this encounter.   Current Outpatient Medications  Medication Sig Dispense Refill Last Dose   CALCIUM PO Take 2 tablets by mouth 2 (two) times daily with a meal.      Camphor-Menthol-Capsicum (TIGER BALM PAIN RELIEVING) 80-24-16 MG PTCH Apply 1 patch topically daily as needed (pain).      Camphor-Menthol-Methyl Sal (TIGER BALM MUSCLE RUB EX) Apply 1 application topically daily as needed (muslce pain).      Cholecalciferol (VITAMIN D3) LIQD Place 4,000 Units under the tongue daily.      CRANBERRY PO Take 1 tablet by mouth at bedtime.      Digestive Enzyme CAPS Take 1 capsule by mouth daily with supper.      escitalopram (LEXAPRO) 20 MG tablet TAKE 1 TABLET BY MOUTH EVERY DAY (Patient taking differently: Take 20 mg by mouth daily.) 90 tablet 3    Ginkgo Biloba (GINKGO PO) Take 1 capsule by mouth daily.      levothyroxine (SYNTHROID) 50 MCG tablet TAKE 1 TABLET BY MOUTH EVERY DAY BEFORE BREAKFAST (Patient taking differently: Take 50 mcg by mouth at bedtime.) 90 tablet 2    nystatin (MYCOSTATIN) 500000 units TABS tablet Take 500,000 Units by mouth  2 (two) times daily as needed (yeast infections).      Omega 3-6-9 Fatty Acids (SUPER OMEGA-3) CAPS Take 1 capsule by mouth 2 (two) times daily with a meal.      OVER THE COUNTER MEDICATION Take 1 tablet by mouth 2 (two) times daily with a meal. Omegazyme Ultra      OVER THE COUNTER MEDICATION Take 1-2 tablets by mouth See admin instructions. Cholesterol Regulator 2 - Take 1 tablet at breakfast and 2 tablets at dinner      Probiotic Product (PROBIOTIC PO) Take 2 capsules by mouth at bedtime.      celecoxib (CELEBREX) 200 MG capsule Take 1 capsule (200 mg total) by mouth 2 (two) times daily. (Patient not taking: No sig reported) 60 capsule 0  Not Taking at Unknown time   docusate sodium (COLACE) 100 MG capsule Take 1 capsule (100 mg total) by mouth 2 (two) times daily. (Patient not taking: No sig reported) 28 capsule 0 Not Taking at Unknown time   ferrous sulfate (FERROUSUL) 325 (65 FE) MG tablet Take 1 tablet (325 mg total) by mouth 3 (three) times daily with meals for 14 days. (Patient not taking: Reported on 04/08/2021) 42 tablet 0 Not Taking at Unknown time   HYDROcodone-acetaminophen (NORCO) 7.5-325 MG tablet Take 1-2 tablets by mouth every 4 (four) hours as needed for moderate pain. (Patient not taking: No sig reported) 60 tablet 0 Not Taking at Unknown time   hydrocortisone (ANUSOL-HC) 25 MG suppository Place 1 suppository (25 mg total) rectally at bedtime. (Patient not taking: No sig reported) 12 suppository 3 Not Taking at Unknown time   methocarbamol (ROBAXIN) 500 MG tablet Take 1 tablet (500 mg total) by mouth every 6 (six) hours as needed for muscle spasms. (Patient not taking: No sig reported) 40 tablet 0 Not Taking at Unknown time   polyethylene glycol (MIRALAX / GLYCOLAX) 17 g packet Take 17 g by mouth 2 (two) times daily. (Patient not taking: No sig reported) 28 packet 0 Not Taking at Unknown time   Allergies  Allergen Reactions   Lactose Intolerance (Gi) Shortness Of Breath    congestion   Terazol [Terconazole]     Systemic reaction   Wheat Bran Shortness Of Breath    congestion   Nickel    Other Itching and Rash    Stainless steel- SURGICAL STAPLES -     Social History   Tobacco Use   Smoking status: Former    Pack years: 0.00   Smokeless tobacco: Never   Tobacco comments:    quit in 20's   Substance Use Topics   Alcohol use: Not Currently    Alcohol/week: 4.0 standard drinks    Types: 2 Glasses of wine, 2 Standard drinks or equivalent per week    Comment: occasional at dinner    Family History  Problem Relation Age of Onset   Breast cancer Sister 72   Liver cancer Sister 76       mets from breast    Testicular cancer Father 46   Colon polyps Neg Hx    Colon cancer Neg Hx    Esophageal cancer Neg Hx    Stomach cancer Neg Hx      Review of Systems  Constitutional:  Negative for chills and fever.  Respiratory:  Negative for cough and shortness of breath.   Cardiovascular:  Negative for chest pain.  Gastrointestinal:  Negative for nausea and vomiting.  Musculoskeletal:  Positive for arthralgias.   Objective:  Physical Exam Well nourished and well developed. General: Alert and oriented x3, cooperative and pleasant, no acute distress. Head: normocephalic, atraumatic, neck supple. Eyes: EOMI.  Musculoskeletal: Left knee exam: No palpable effusion, warmth or erythema Tenderness over the anterior aspect of the knee Crepitation with full flexion Tenderness laterally Mild passive hyperextension Full flexion with crepitation and pain anterior and lateral  Calves soft and nontender. Motor function intact in LE. Strength 5/5 LE bilaterally. Neuro: Distal pulses 2+. Sensation to light touch intact in LE.  Vital signs in last 24 hours:    Labs:   Estimated body mass index is 28.77 kg/m as calculated from the following:   Height as of 04/12/21: 5\' 3"  (1.6 m).   Weight as of 04/12/21: 73.7 kg.   Imaging Review Plain radiographs demonstrate severe degenerative joint disease of the left knee(s). The overall alignment isneutral. The bone quality appears to be adequate for age and reported activity level.      Assessment/Plan:  End stage arthritis, left knee   The patient history, physical examination, clinical judgment of the provider and imaging studies are consistent with end stage degenerative joint disease of the left knee(s) and total knee arthroplasty is deemed medically necessary. The treatment options including medical management, injection therapy arthroscopy and arthroplasty were discussed at length. The risks and benefits of total knee arthroplasty were presented and  reviewed. The risks due to aseptic loosening, infection, stiffness, patella tracking problems, thromboembolic complications and other imponderables were discussed. The patient acknowledged the explanation, agreed to proceed with the plan and consent was signed. Patient is being admitted for inpatient treatment for surgery, pain control, PT, OT, prophylactic antibiotics, VTE prophylaxis, progressive ambulation and ADL's and discharge planning. The patient is planning to be discharged  home.  Therapy Plans: outpatient therapy at [facility] Disposition: Home with husband Planned DVT Prophylaxis: aspirin 81mg  BID DME needed: none PCP: Tedra Senegal, clearance received TXA: IV Allergies: Terazol - systemic reaction (swelling, pain, etc), Surgical steel - skin irritation Anesthesia Concerns: none BMI: 31.9 Not diabetic.  Other: - Has Norco, Robaxin, Celebrex at home - SDD --- requires her husband to stay overnight if unable to go home    Patient's anticipated LOS is less than 2 midnights, meeting these requirements: - Younger than 12 - Lives within 1 hour of care - Has a competent adult at home to recover with post-op recover - NO history of  - Chronic pain requiring opiods  - Diabetes  - Coronary Artery Disease  - Heart failure  - Heart attack  - Stroke  - DVT/VTE  - Cardiac arrhythmia  - Respiratory Failure/COPD  - Renal failure  - Anemia  - Advanced Liver disease  Griffith Citron, PA-C Orthopedic Surgery [facility] [place] [phone]

## 2021-04-16 ENCOUNTER — Ambulatory Visit (HOSPITAL_COMMUNITY)
Admission: RE | Admit: 2021-04-16 | Discharge: 2021-04-16 | Disposition: A | Discharge status: Home / Self Care | Payer: Medicare PPO | Attending: Orthopedic Surgery | Admitting: Orthopedic Surgery

## 2021-04-16 ENCOUNTER — Encounter (HOSPITAL_COMMUNITY): Payer: Self-pay | Admitting: Orthopedic Surgery

## 2021-04-16 ENCOUNTER — Encounter (HOSPITAL_COMMUNITY)
Admission: RE | Disposition: A | Discharge status: Home / Self Care | Payer: Self-pay | Source: Home / Self Care | Attending: Orthopedic Surgery

## 2021-04-16 ENCOUNTER — Ambulatory Visit (HOSPITAL_COMMUNITY): Payer: Medicare PPO | Admitting: Certified Registered Nurse Anesthetist

## 2021-04-16 DIAGNOSIS — G8918 Other acute postprocedural pain: Secondary | ICD-10-CM | POA: Diagnosis not present

## 2021-04-16 DIAGNOSIS — Z96652 Presence of left artificial knee joint: Secondary | ICD-10-CM

## 2021-04-16 DIAGNOSIS — Z79899 Other long term (current) drug therapy: Secondary | ICD-10-CM | POA: Diagnosis not present

## 2021-04-16 DIAGNOSIS — Z79890 Hormone replacement therapy (postmenopausal): Secondary | ICD-10-CM | POA: Diagnosis not present

## 2021-04-16 DIAGNOSIS — Z87891 Personal history of nicotine dependence: Secondary | ICD-10-CM | POA: Diagnosis not present

## 2021-04-16 DIAGNOSIS — E039 Hypothyroidism, unspecified: Secondary | ICD-10-CM | POA: Insufficient documentation

## 2021-04-16 DIAGNOSIS — M1712 Unilateral primary osteoarthritis, left knee: Secondary | ICD-10-CM | POA: Insufficient documentation

## 2021-04-16 DIAGNOSIS — M25562 Pain in left knee: Secondary | ICD-10-CM | POA: Diagnosis not present

## 2021-04-16 DIAGNOSIS — E559 Vitamin D deficiency, unspecified: Secondary | ICD-10-CM | POA: Diagnosis not present

## 2021-04-16 HISTORY — PX: TOTAL KNEE ARTHROPLASTY: SHX125

## 2021-04-16 SURGERY — ARTHROPLASTY, KNEE, TOTAL
Anesthesia: Monitor Anesthesia Care | Site: Knee | Laterality: Left

## 2021-04-16 MED ORDER — ASPIRIN 81 MG PO CHEW: 81.0000 mg | CHEWABLE_TABLET | Freq: Two times a day (BID) | ORAL | 0 refills | Status: AC

## 2021-04-16 MED ORDER — DEXAMETHASONE SODIUM PHOSPHATE 10 MG/ML IJ SOLN
INTRAMUSCULAR | Status: DC | PRN
  Administered 2021-04-16: 10 mg via INTRAVENOUS

## 2021-04-16 MED ORDER — FENTANYL CITRATE (PF) 100 MCG/2ML IJ SOLN
INTRAMUSCULAR | Status: AC
  Filled 2021-04-16: qty 2

## 2021-04-16 MED ORDER — CHLORHEXIDINE GLUCONATE 0.12 % MT SOLN
15.0000 mL | Freq: Once | OROMUCOSAL | Status: AC
  Administered 2021-04-16: 15 mL via OROMUCOSAL

## 2021-04-16 MED ORDER — KETOROLAC TROMETHAMINE 30 MG/ML IJ SOLN
INTRAMUSCULAR | Status: AC
  Filled 2021-04-16: qty 1

## 2021-04-16 MED ORDER — LACTATED RINGERS IV BOLUS
250.0000 mL | Freq: Once | INTRAVENOUS | Status: AC
  Administered 2021-04-16: 250 mL via INTRAVENOUS

## 2021-04-16 MED ORDER — LACTATED RINGERS IV SOLN: INTRAVENOUS | Status: DC

## 2021-04-16 MED ORDER — KETOROLAC TROMETHAMINE 30 MG/ML IJ SOLN
INTRAMUSCULAR | Status: DC | PRN
  Administered 2021-04-16: 30 mg

## 2021-04-16 MED ORDER — 0.9 % SODIUM CHLORIDE (POUR BTL) OPTIME
TOPICAL | Status: DC | PRN
  Administered 2021-04-16: 1000 mL

## 2021-04-16 MED ORDER — BUPIVACAINE-EPINEPHRINE (PF) 0.25% -1:200000 IJ SOLN
INTRAMUSCULAR | Status: DC | PRN
  Administered 2021-04-16: 30 mL

## 2021-04-16 MED ORDER — MIDAZOLAM HCL 5 MG/5ML IJ SOLN
INTRAMUSCULAR | Status: DC | PRN
  Administered 2021-04-16: 2 mg via INTRAVENOUS

## 2021-04-16 MED ORDER — PHENYLEPHRINE HCL-NACL 10-0.9 MG/250ML-% IV SOLN
INTRAVENOUS | Status: DC | PRN
  Administered 2021-04-16: 50 ug/min via INTRAVENOUS

## 2021-04-16 MED ORDER — CEFAZOLIN SODIUM-DEXTROSE 2-4 GM/100ML-% IV SOLN
INTRAVENOUS | Status: AC
  Filled 2021-04-16: qty 100

## 2021-04-16 MED ORDER — METHOCARBAMOL 500 MG IVPB - SIMPLE MED: 500.0000 mg | Freq: Four times a day (QID) | INTRAVENOUS | Status: DC | PRN

## 2021-04-16 MED ORDER — ACETAMINOPHEN 160 MG/5ML PO SOLN: 1000.0000 mg | Freq: Once | ORAL | Status: DC | PRN

## 2021-04-16 MED ORDER — BUPIVACAINE IN DEXTROSE 0.75-8.25 % IT SOLN
INTRATHECAL | Status: DC | PRN
  Administered 2021-04-16: 1.4 mL via INTRATHECAL

## 2021-04-16 MED ORDER — TRANEXAMIC ACID-NACL 1000-0.7 MG/100ML-% IV SOLN
INTRAVENOUS | Status: AC
  Filled 2021-04-16: qty 100

## 2021-04-16 MED ORDER — TRANEXAMIC ACID-NACL 1000-0.7 MG/100ML-% IV SOLN
1000.0000 mg | Freq: Once | INTRAVENOUS | Status: AC
  Administered 2021-04-16: 1000 mg via INTRAVENOUS

## 2021-04-16 MED ORDER — LACTATED RINGERS IV BOLUS
500.0000 mL | Freq: Once | INTRAVENOUS | Status: AC
  Administered 2021-04-16: 500 mL via INTRAVENOUS

## 2021-04-16 MED ORDER — ACETAMINOPHEN 500 MG PO TABS: 1000.0000 mg | ORAL_TABLET | Freq: Once | ORAL | Status: DC | PRN

## 2021-04-16 MED ORDER — PROPOFOL 500 MG/50ML IV EMUL
INTRAVENOUS | Status: DC | PRN
  Administered 2021-04-16: 100 ug/kg/min via INTRAVENOUS

## 2021-04-16 MED ORDER — ACETAMINOPHEN 10 MG/ML IV SOLN: 1000.0000 mg | Freq: Once | INTRAVENOUS | Status: DC | PRN

## 2021-04-16 MED ORDER — SODIUM CHLORIDE (PF) 0.9 % IJ SOLN
INTRAMUSCULAR | Status: AC
  Filled 2021-04-16: qty 30

## 2021-04-16 MED ORDER — ONDANSETRON HCL 4 MG/2ML IJ SOLN
INTRAMUSCULAR | Status: DC | PRN
  Administered 2021-04-16: 4 mg via INTRAVENOUS

## 2021-04-16 MED ORDER — DEXAMETHASONE SODIUM PHOSPHATE 10 MG/ML IJ SOLN
INTRAMUSCULAR | Status: AC
  Filled 2021-04-16: qty 1

## 2021-04-16 MED ORDER — CEFAZOLIN SODIUM-DEXTROSE 2-4 GM/100ML-% IV SOLN
2.0000 g | Freq: Four times a day (QID) | INTRAVENOUS | Status: DC
  Administered 2021-04-16: 2 g via INTRAVENOUS

## 2021-04-16 MED ORDER — CEFAZOLIN SODIUM-DEXTROSE 2-4 GM/100ML-% IV SOLN
2.0000 g | INTRAVENOUS | Status: AC
  Administered 2021-04-16: 2 g via INTRAVENOUS

## 2021-04-16 MED ORDER — OXYCODONE HCL 5 MG/5ML PO SOLN: 5.0000 mg | Freq: Once | ORAL | Status: DC | PRN

## 2021-04-16 MED ORDER — PHENYLEPHRINE HCL (PRESSORS) 10 MG/ML IV SOLN
INTRAVENOUS | Status: AC
  Filled 2021-04-16: qty 1

## 2021-04-16 MED ORDER — OXYCODONE HCL 5 MG PO TABS: 5.0000 mg | ORAL_TABLET | Freq: Once | ORAL | Status: DC | PRN

## 2021-04-16 MED ORDER — PROPOFOL 1000 MG/100ML IV EMUL
INTRAVENOUS | Status: AC
  Filled 2021-04-16: qty 200

## 2021-04-16 MED ORDER — ORAL CARE MOUTH RINSE: 15.0000 mL | Freq: Once | OROMUCOSAL | Status: AC

## 2021-04-16 MED ORDER — LACTATED RINGERS IV BOLUS: 250.0000 mL | Freq: Once | INTRAVENOUS | Status: DC

## 2021-04-16 MED ORDER — MIDAZOLAM HCL 2 MG/2ML IJ SOLN
INTRAMUSCULAR | Status: AC
  Filled 2021-04-16: qty 2

## 2021-04-16 MED ORDER — FENTANYL CITRATE (PF) 100 MCG/2ML IJ SOLN: 25.0000 ug | INTRAMUSCULAR | Status: DC | PRN

## 2021-04-16 MED ORDER — ONDANSETRON HCL 4 MG/2ML IJ SOLN
INTRAMUSCULAR | Status: AC
  Filled 2021-04-16: qty 2

## 2021-04-16 MED ORDER — STERILE WATER FOR IRRIGATION IR SOLN
Status: DC | PRN
  Administered 2021-04-16: 2000 mL

## 2021-04-16 MED ORDER — BUPIVACAINE-EPINEPHRINE (PF) 0.25% -1:200000 IJ SOLN
INTRAMUSCULAR | Status: AC
  Filled 2021-04-16: qty 30

## 2021-04-16 MED ORDER — PROPOFOL 10 MG/ML IV BOLUS
INTRAVENOUS | Status: DC | PRN
  Administered 2021-04-16: 30 mg via INTRAVENOUS

## 2021-04-16 MED ORDER — TRANEXAMIC ACID-NACL 1000-0.7 MG/100ML-% IV SOLN
1000.0000 mg | INTRAVENOUS | Status: AC
  Administered 2021-04-16: 1000 mg via INTRAVENOUS

## 2021-04-16 MED ORDER — SODIUM CHLORIDE (PF) 0.9 % IJ SOLN
INTRAMUSCULAR | Status: DC | PRN
  Administered 2021-04-16: 30 mL

## 2021-04-16 MED ORDER — FENTANYL CITRATE (PF) 100 MCG/2ML IJ SOLN
INTRAMUSCULAR | Status: DC | PRN
  Administered 2021-04-16: 100 ug via INTRAVENOUS

## 2021-04-16 MED ORDER — METHOCARBAMOL 500 MG PO TABS: 500.0000 mg | ORAL_TABLET | Freq: Four times a day (QID) | ORAL | Status: DC | PRN

## 2021-04-16 MED ORDER — PHENYLEPHRINE 40 MCG/ML (10ML) SYRINGE FOR IV PUSH (FOR BLOOD PRESSURE SUPPORT)
PREFILLED_SYRINGE | INTRAVENOUS | Status: DC | PRN
  Administered 2021-04-16 (×2): 120 ug via INTRAVENOUS

## 2021-04-16 SURGICAL SUPPLY — 56 items
ADH SKN CLS APL DERMABOND .7 (GAUZE/BANDAGES/DRESSINGS) ×1
BAG SPEC THK2 15X12 ZIP CLS (MISCELLANEOUS)
BAG ZIPLOCK 12X15 (MISCELLANEOUS) IMPLANT
BLADE SAW SGTL 11.0X1.19X90.0M (BLADE) IMPLANT
BLADE SAW SGTL 13.0X1.19X90.0M (BLADE) ×2 IMPLANT
BLADE SURG SZ10 CARB STEEL (BLADE) ×4 IMPLANT
BNDG ELASTIC 6X5.8 VLCR STR LF (GAUZE/BANDAGES/DRESSINGS) ×2 IMPLANT
BOWL SMART MIX CTS (DISPOSABLE) ×2 IMPLANT
BSPLAT TIB 5D E CMNT STM LT (Knees) ×1 IMPLANT
CEMENT HV SMART SET (Cement) ×2 IMPLANT
COMP FEMUR CR CMNTD LT SZ6 NRW (Joint) ×2 IMPLANT
COMPONENT FEMRL CMNTDLTSZ6NRW (Joint) ×1 IMPLANT
COVER WAND RF STERILE (DRAPES) IMPLANT
CUFF TOURN SGL QUICK 34 (TOURNIQUET CUFF) ×2
CUFF TRNQT CYL 34X4.125X (TOURNIQUET CUFF) ×1 IMPLANT
DECANTER SPIKE VIAL GLASS SM (MISCELLANEOUS) ×4 IMPLANT
DERMABOND ADVANCED (GAUZE/BANDAGES/DRESSINGS) ×1
DERMABOND ADVANCED .7 DNX12 (GAUZE/BANDAGES/DRESSINGS) ×1 IMPLANT
DRAPE U-SHAPE 47X51 STRL (DRAPES) ×2 IMPLANT
DRSG AQUACEL AG ADV 3.5X10 (GAUZE/BANDAGES/DRESSINGS) ×1 IMPLANT
DURAPREP 26ML APPLICATOR (WOUND CARE) ×4 IMPLANT
ELECT REM PT RETURN 15FT ADLT (MISCELLANEOUS) ×2 IMPLANT
GLOVE SURG ENC MOIS LTX SZ6 (GLOVE) IMPLANT
GLOVE SURG UNDER LTX SZ7.5 (GLOVE) ×2 IMPLANT
GLOVE SURG UNDER POLY LF SZ6.5 (GLOVE) IMPLANT
GLOVE SURG UNDER POLY LF SZ7.5 (GLOVE) ×2 IMPLANT
GOWN STRL REUS W/TWL LRG LVL3 (GOWN DISPOSABLE) ×2 IMPLANT
HANDPIECE INTERPULSE COAX TIP (DISPOSABLE) ×2
HDLS TROCR DRIL PIN KNEE 75 (PIN) ×2
HOLDER FOLEY CATH W/STRAP (MISCELLANEOUS) ×1 IMPLANT
KIT TURNOVER KIT A (KITS) ×2 IMPLANT
MANIFOLD NEPTUNE II (INSTRUMENTS) ×2 IMPLANT
NDL SAFETY ECLIPSE 18X1.5 (NEEDLE) IMPLANT
NEEDLE HYPO 18GX1.5 SHARP (NEEDLE)
NS IRRIG 1000ML POUR BTL (IV SOLUTION) ×2 IMPLANT
PACK TOTAL KNEE CUSTOM (KITS) ×2 IMPLANT
PENCIL SMOKE EVACUATOR (MISCELLANEOUS) IMPLANT
PIN DRILL HDLS TROCAR 75 4PK (PIN) IMPLANT
PROTECTOR NERVE ULNAR (MISCELLANEOUS) ×2 IMPLANT
SCREW FEMALE HEX FIX 25X2.5 (ORTHOPEDIC DISPOSABLE SUPPLIES) ×1 IMPLANT
SET HNDPC FAN SPRY TIP SCT (DISPOSABLE) ×1 IMPLANT
SET PAD KNEE POSITIONER (MISCELLANEOUS) ×2 IMPLANT
STEM MED AS PERS SZ6-7 11 (Stem) ×2 IMPLANT
STEM POLY PAT PLY 35M KNEE (Knees) ×1 IMPLANT
STEM TIBIA 5 DEG SZ E L KNEE (Knees) IMPLANT
SUT MNCRL AB 4-0 PS2 18 (SUTURE) ×2 IMPLANT
SUT STRATAFIX PDS+ 0 24IN (SUTURE) ×2 IMPLANT
SUT VIC AB 1 CT1 36 (SUTURE) ×2 IMPLANT
SUT VIC AB 2-0 CT1 27 (SUTURE) ×6
SUT VIC AB 2-0 CT1 TAPERPNT 27 (SUTURE) ×3 IMPLANT
SYR 3ML LL SCALE MARK (SYRINGE) ×2 IMPLANT
TIBIA STEM 5 DEG SZ E L KNEE (Knees) ×2 IMPLANT
TRAY FOLEY MTR SLVR 16FR STAT (SET/KITS/TRAYS/PACK) ×2 IMPLANT
TUBE SUCTION HIGH CAP CLEAR NV (SUCTIONS) ×2 IMPLANT
WATER STERILE IRR 1000ML POUR (IV SOLUTION) ×4 IMPLANT
WRAP KNEE MAXI GEL POST OP (GAUZE/BANDAGES/DRESSINGS) ×2 IMPLANT

## 2021-04-16 NOTE — Interval H&P Note (Signed)
History and Physical Interval Note:  04/16/2021 7:12 AM  Mary Murillo  has presented today for surgery, with the diagnosis of Left knee osteoarthritis.  The various methods of treatment have been discussed with the patient and family. After consideration of risks, benefits and other options for treatment, the patient has consented to  Procedure(s) with comments: TOTAL KNEE ARTHROPLASTY (Left) - 70 mins *Has a Nickel Allergy! as a surgical intervention.  The patient's history has been reviewed, patient examined, no change in status, stable for surgery.  I have reviewed the patient's chart and labs.  Questions were answered to the patient's satisfaction.     Mauri Pole

## 2021-04-16 NOTE — Progress Notes (Signed)
Physical Therapy Treatment Patient Details Name: Mary Murillo MRN: [id number] DOB: 1951-09-07 Today's Date: 04/16/2021    History of Present Illness Patient is 70 y.o. female s/p Lt TKA On 04/16/21 with PMH significant for HLD, hypothyroidism, OA, anxiety, depression, Rt TKA on 01/15/21.    PT Comments    Patient seen for additional therapy session to progress mobility at safe level for discharge home. Patient required min guard/supervision for safety with transfers, gait, and stair mobility. She required use of knee immobilizer for safety due to poor quad activation in order to prevent buckling during weight bearing activity. HEP reviewed with repetitions on Rt LE due to inability to complete on Lt. She is currently at safe level to discharge home with assist provided from husband.  Patient was issued basic knee immobilizer (KI) for safe DC home due to poor knee extensor muscle control. Patient was instructed on donning/doffing of KI and instructed on short term use for ambulation until full quadriceps activation has returned. Patient educated on functional/MMT using straight leg raise (SLR) test to determine readiness to discontinue use of KI for ambulation. Pt educated not to wear brace at night and to remove for exercises.     Follow Up Recommendations  Outpatient PT;Follow surgeon's recommendation for DC plan and follow-up therapies     Equipment Recommendations  None recommended by PT    Recommendations for Other Services       Precautions / Restrictions Precautions Precautions: Fall Restrictions Weight Bearing Restrictions: No Other Position/Activity Restrictions: WBAT    Mobility  Bed Mobility Overal bed mobility: Needs Assistance Bed Mobility: Supine to Sit     Supine to sit: HOB elevated;Min guard     General bed mobility comments: pt in knee immobilizer and able to bring Lt LE off EOB without assist.    Transfers Overall transfer level: Needs  assistance Equipment used: Rolling walker (2 wheeled) Transfers: Sit to/from Stand Sit to Stand: Min guard;Supervision         General transfer comment: pt with good recall for hand placement on RW. no buckling with good use of UE's to reduce WB on Lt LE.  Ambulation/Gait Ambulation/Gait assistance: Min guard;Supervision Gait Distance (Feet): 100 Feet Assistive device: Rolling walker (2 wheeled) Gait Pattern/deviations: Step-to pattern;Decreased stride length;Decreased stance time - left;Decreased weight shift to left Gait velocity: decr   General Gait Details: cues for step to pattern and proximity to RW, no buckling noted on Lt LE with pt's knee in immobilizer. pt progressed from min guard to supervision.   Stairs Stairs: Yes Stairs assistance: Min assist Stair Management: No rails;Step to pattern;Backwards;With walker Number of Stairs: 3 General stair comments: cues for safe step sequencing and walker management. no overt LOB noted and pt verbalized safe guarding/assist for family to provide.   Wheelchair Mobility    Modified Rankin (Stroke Patients Only)       Balance Overall balance assessment: Needs assistance Sitting-balance support: Feet supported Sitting balance-Leahy Scale: Good     Standing balance support: During functional activity;Bilateral upper extremity supported Standing balance-Leahy Scale: Poor                              Cognition Arousal/Alertness: Awake/alert Behavior During Therapy: WFL for tasks assessed/performed Overall Cognitive Status: Within Functional Limits for tasks assessed  Exercises Total Joint Exercises Ankle Circles/Pumps: AROM;Both;20 reps;Seated Quad Sets: AROM;Both;5 reps;Seated Short Arc Quad: AROM;Right;Other reps (comment);Seated (3) Heel Slides: AROM;Right;Other reps (comment);Seated (3) Hip ABduction/ADduction: AROM;Right;Other reps  (comment);Seated (3)    General Comments        Pertinent Vitals/Pain Pain Assessment: No/denies pain    Home Living Family/patient expects to be discharged to:: Private residence Living Arrangements: Spouse/significant other Available Help at Discharge: Family Type of Home: House Home Access: Stairs to enter Entrance Stairs-Rails: None Home Layout: Two level;Full bath on main level;Able to live on main level with bedroom/bathroom;Bed/bath upstairs Home Equipment: Walker - 2 wheels;Bedside commode;Cane - single point      Prior Function Level of Independence: Independent          PT Goals (current goals can now be found in the care plan section) Acute Rehab PT Goals Patient Stated Goal: get home today PT Goal Formulation: With patient Time For Goal Achievement: 04/23/21 Potential to Achieve Goals: Good Progress towards PT goals: Progressing toward goals    Frequency    7X/week      PT Plan      Co-evaluation              AM-PAC PT "6 Clicks" Mobility   Outcome Measure  Help needed turning from your back to your side while in a flat bed without using bedrails?: A Little Help needed moving from lying on your back to sitting on the side of a flat bed without using bedrails?: A Little Help needed moving to and from a bed to a chair (including a wheelchair)?: A Little Help needed standing up from a chair using your arms (e.g., wheelchair or bedside chair)?: A Little Help needed to walk in hospital room?: A Little Help needed climbing 3-5 steps with a railing? : A Lot 6 Click Score: 17    End of Session Equipment Utilized During Treatment: Gait belt Activity Tolerance: Patient tolerated treatment well Patient left: with call bell/phone within reach;in bed Nurse Communication: Mobility status PT Visit Diagnosis: Muscle weakness (generalized) (M62.81);Difficulty in walking, not elsewhere classified (R26.2)     Time: 1740-8144 PT Time Calculation (min)  (ACUTE ONLY): 32 min  Charges:  $Gait Training: 8-22 mins $Therapeutic Exercise: 8-22 mins                     Verner Mould, DPT Acute Rehabilitation Services Office [phone] Pager [phone]    Jacques Navy 04/16/2021, 4:10 PM

## 2021-04-16 NOTE — Care Plan (Signed)
Ortho Bundle Case Management Note  Patient Details  Name: Mary Murillo MRN: [id number] Date of Birth: 06-Apr-1951  L TKA on 04-16-21 DCP:  Home with husband.  2 story home with 3-4 ste. DME:  No needs.  Has a RW and 3-in-1. PT:  [facility].  PT eval scheduled on 04-19-21.   DME Arranged:  N/A DME Agency:  NA  HH Arranged:  NA HH Agency:  NA  Additional Comments: Please contact me with any questions of if this plan should need to change.  Marianne Sofia, RN,CCM [facility]  [phone] 04/16/2021, 8:52 AM

## 2021-04-16 NOTE — Evaluation (Signed)
Physical Therapy Evaluation Patient Details Name: Mary Murillo MRN: [id number] DOB: 10-18-1951 Today's Date: 04/16/2021   History of Present Illness  Patient is 70 y.o. female s/p Lt TKA On 04/16/21 with PMH significant for HLD, hypothyroidism, OA, anxiety, depression, Rt TKA on 01/15/21.   Clinical Impression  Mary Murillo is a 70 y.o. female POD 0 s/p Lt TKA. Patient reports independence with mobility at baseline. Patient is now limited by functional impairments (see PT problem list below) and requires min assist for bed mobility and transfers with RW. Patient limited by significant quad weakness and required min assist/manual blocking for Lt knee to prevent buckling with stand step transfer to move BSC. Patient will benefit from continued skilled PT interventions to address impairments and progress towards PLOF. Acute PT will follow to progress mobility and stair training in preparation for safe discharge home.     Follow Up Recommendations Outpatient PT;Follow surgeon's recommendation for DC plan and follow-up therapies    Equipment Recommendations  None recommended by PT    Recommendations for Other Services       Precautions / Restrictions Precautions Precautions: Fall Restrictions Weight Bearing Restrictions: No Other Position/Activity Restrictions: WBAT      Mobility  Bed Mobility Overal bed mobility: Needs Assistance Bed Mobility: Supine to Sit;Sit to Supine     Supine to sit: Min assist;HOB elevated Sit to supine: Min assist;HOB elevated   General bed mobility comments: assist for Rt LE to move EOB and to lift Rt LE into bed to return to supine.    Transfers Overall transfer level: Needs assistance Equipment used: Rolling walker (2 wheeled) Transfers: Sit to/from [employer] Sit to Stand: Min assist Stand pivot transfers: Min assist       General transfer comment: Cues to avoid WB on Lt LE in standing and while stepping to move bed<>BSC. Patient  required manual blocking of Lt knee to prevent buckling and mostly maintained NWB on Lt LE.  Ambulation/Gait                Stairs            Wheelchair Mobility    Modified Rankin (Stroke Patients Only)       Balance Overall balance assessment: Needs assistance Sitting-balance support: Feet supported Sitting balance-Leahy Scale: Good     Standing balance support: During functional activity;Bilateral upper extremity supported Standing balance-Leahy Scale: Poor                               Pertinent Vitals/Pain Pain Assessment: No/denies pain    Home Living Family/patient expects to be discharged to:: Private residence Living Arrangements: Spouse/significant other Available Help at Discharge: Family Type of Home: House Home Access: Stairs to enter Entrance Stairs-Rails: None Entrance Stairs-Number of Steps: 2 Home Layout: Two level;Full bath on main level;Able to live on main level with bedroom/bathroom;Bed/bath upstairs Home Equipment: Walker - 2 wheels;Bedside commode;Cane - single point      Prior Function Level of Independence: Independent               Hand Dominance   Dominant Hand: Right    Extremity/Trunk Assessment   Upper Extremity Assessment Upper Extremity Assessment: Overall WFL for tasks assessed    Lower Extremity Assessment LLE Deficits / Details: pt with palpable quad set but poor activation, significant extensor lag with SLR LLE Sensation: WNL LLE Coordination: decreased gross motor    Cervical /  Trunk Assessment Cervical / Trunk Assessment: Normal  Communication   Communication: No difficulties  Cognition Arousal/Alertness: Awake/alert Behavior During Therapy: WFL for tasks assessed/performed Overall Cognitive Status: Within Functional Limits for tasks assessed                                        General Comments      Exercises     Assessment/Plan    PT Assessment Patient  needs continued PT services  PT Problem List Decreased strength;Decreased range of motion;Decreased activity tolerance;Decreased balance;Decreased mobility;Decreased knowledge of use of DME;Decreased knowledge of precautions;Pain       PT Treatment Interventions DME instruction;Gait training;Stair training;Functional mobility training;Therapeutic activities;Therapeutic exercise;Balance training;Patient/family education    PT Goals (Current goals can be found in the Care Plan section)  Acute Rehab PT Goals Patient Stated Goal: get home today PT Goal Formulation: With patient Time For Goal Achievement: 04/23/21 Potential to Achieve Goals: Good    Frequency 7X/week   Barriers to discharge        Co-evaluation               AM-PAC PT "6 Clicks" Mobility  Outcome Measure Help needed turning from your back to your side while in a flat bed without using bedrails?: A Little Help needed moving from lying on your back to sitting on the side of a flat bed without using bedrails?: A Little Help needed moving to and from a bed to a chair (including a wheelchair)?: A Little Help needed standing up from a chair using your arms (e.g., wheelchair or bedside chair)?: A Little Help needed to walk in hospital room?: A Little Help needed climbing 3-5 steps with a railing? : A Lot 6 Click Score: 17    End of Session Equipment Utilized During Treatment: Gait belt Activity Tolerance: Patient tolerated treatment well Patient left: with call bell/phone within reach;in bed Nurse Communication: Mobility status PT Visit Diagnosis: Muscle weakness (generalized) (M62.81);Difficulty in walking, not elsewhere classified (R26.2)       04/16/21 1100  PT Time Calculation  PT Start Time (ACUTE ONLY) 1125  PT Stop Time (ACUTE ONLY) 1143  PT Time Calculation (min) (ACUTE ONLY) 18 min  PT General Charges  $$ ACUTE PT VISIT 1 Visit  PT Evaluation  $PT Eval Low Complexity 1 Low     Verner Mould,  DPT Acute Rehabilitation Services Office [phone] Pager [phone]   Jacques Navy 04/16/2021, 3:54 PM

## 2021-04-16 NOTE — Brief Op Note (Signed)
04/16/2021  8:51 AM  PATIENT:  Mary Murillo  70 y.o. female  PRE-OPERATIVE DIAGNOSIS:  Left knee osteoarthritis  POST-OPERATIVE DIAGNOSIS:  Left knee osteoarthritis  PROCEDURE:  Procedure(s) with comments: TOTAL KNEE ARTHROPLASTY (Left) - 70 mins *Has a Nickel Allergy!  SURGEON:  Surgeon(s) and Role:    * Paralee Cancel, MD - Primary  PHYSICIAN ASSISTANT: Costella Hatcher, PA-C  ANESTHESIA:   regional and spinal  EBL:  <100 cc  BLOOD ADMINISTERED:none  DRAINS: none   LOCAL MEDICATIONS USED:  MARCAINE     SPECIMEN:  No Specimen  DISPOSITION OF SPECIMEN:  N/A  COUNTS:  YES  TOURNIQUET:   Total Tourniquet Time Documented: Thigh (Left) - 33 minutes Total: Thigh (Left) - 33 minutes   DICTATION: .Other Dictation: Dictation Number [id number]  PLAN OF CARE: Discharge to home after PACU  PATIENT DISPOSITION:  PACU - hemodynamically stable.   Delay start of Pharmacological VTE agent (>24hrs) due to surgical blood loss or risk of bleeding: no

## 2021-04-16 NOTE — Transfer of Care (Signed)
Immediate Anesthesia Transfer of Care Note  Patient: Mary Murillo  Procedure(s) Performed: TOTAL KNEE ARTHROPLASTY (Left: Knee)  Patient Location: PACU  Anesthesia Type:Spinal  Level of Consciousness: awake, alert  and oriented  Airway & Oxygen Therapy: Patient Spontanous Breathing and Patient connected to face mask oxygen  Post-op Assessment: Report given to RN and Post -op Vital signs reviewed and stable  Post vital signs: Reviewed and stable  Last Vitals:  Vitals Value Taken Time  BP 118/71 04/16/21 0915  Temp    Pulse 78 04/16/21 0918  Resp 18 04/16/21 0918  SpO2 94 % 04/16/21 0918  Vitals shown include unvalidated device data.  Last Pain:  Vitals:   04/16/21 0543  TempSrc:   PainSc: 0-No pain      Patients Stated Pain Goal: 4 (15/94/70 7615)  Complications: No notable events documented.

## 2021-04-16 NOTE — Anesthesia Procedure Notes (Signed)
Spinal  Patient location during procedure: OR Start time: 04/16/2021 7:22 AM End time: 04/16/2021 7:24 AM Reason for block: surgical anesthesia Staffing Performed: resident/CRNA  Resident/CRNA: [place], Stephanie C, CRNA Preanesthetic Checklist Completed: patient identified, IV checked, site marked, risks and benefits discussed, surgical consent, monitors and equipment checked, pre-op evaluation and timeout performed Spinal Block Patient position: sitting Prep: DuraPrep and site prepped and draped Patient monitoring: heart rate, cardiac monitor, continuous pulse ox and blood pressure Approach: midline Location: L3-4 Injection technique: single-shot Needle Needle type: Pencan  Needle gauge: 24 G Needle length: 9 cm Assessment Sensory level: T4 Events: CSF return Additional Notes IV functioning, monitors applied to pt. Expiration date of kit checked and confirmed to be in date. Sterile prep and drape, hand hygiene and sterile gloved used. Pt was positioned and spine was prepped in sterile fashion. Skin was anesthetized with lidocaine. Free flow of clear CSF obtained prior to injecting local anesthetic into CSF x 1 attempt. Spinal needle aspirated freely following injection. Needle was carefully withdrawn, and pt tolerated procedure well. Loss of motor and sensory on exam post injection.     

## 2021-04-16 NOTE — Anesthesia Preprocedure Evaluation (Signed)
Anesthesia Evaluation  Patient identified by MRN, date of birth, ID band Patient awake    Reviewed: Allergy & Precautions, NPO status , Patient's Chart, lab work & pertinent test results  History of Anesthesia Complications (+) PONV and history of anesthetic complications  Airway Mallampati: III  TM Distance: >3 FB Neck ROM: Full    Dental  (+) Dental Advisory Given, Teeth Intact   Pulmonary asthma , former smoker,  Covid-19 Nucleic Acid Test Results Lab Results      Component                Value               Date                      SARSCOV2NAA              NEGATIVE            04/12/2021                [place]              NEGATIVE            01/11/2021                [place]              Not Detected        11/27/2020              breath sounds clear to auscultation       Cardiovascular negative cardio ROS   Rhythm:Regular     Neuro/Psych PSYCHIATRIC DISORDERS Anxiety Depression negative neurological ROS     GI/Hepatic negative GI ROS, Neg liver ROS,   Endo/Other  Hypothyroidism   Renal/GU negative Renal ROS     Musculoskeletal  (+) Arthritis ,   Abdominal   Peds  Hematology negative hematology ROS (+) Lab Results      Component                Value               Date                      WBC                      5.7                 04/12/2021                HGB                      14.0                04/12/2021                HCT                      42.5                04/12/2021                MCV                      92.4                04/12/2021  PLT                      365                 04/12/2021           Denies blood thinners   Anesthesia Other Findings   Reproductive/Obstetrics                             Anesthesia Physical Anesthesia Plan  ASA: 2  Anesthesia Plan: MAC, Regional and Spinal   Post-op Pain Management:    Induction:  Intravenous  PONV Risk Score and Plan: 3 and Propofol infusion  Airway Management Planned: Nasal Cannula  Additional Equipment: None  Intra-op Plan:   Post-operative Plan:   Informed Consent: I have reviewed the patients History and Physical, chart, labs and discussed the procedure including the risks, benefits and alternatives for the proposed anesthesia with the patient or authorized representative who has indicated his/her understanding and acceptance.     Dental advisory given  Plan Discussed with: CRNA and Surgeon  Anesthesia Plan Comments:         Anesthesia Quick Evaluation

## 2021-04-16 NOTE — Discharge Instructions (Signed)

## 2021-04-16 NOTE — Progress Notes (Signed)
Orthopedic Tech Progress Note Patient Details:  Mary Murillo April 04, 1951 [id number]  Ortho Devices Type of Ortho Device: Knee Immobilizer Ortho Device/Splint Interventions: Application   Post Interventions Patient Tolerated: Well Instructions Provided: Care of device  [place] Pink 04/16/2021, 12:19 PM

## 2021-04-16 NOTE — Op Note (Signed)
NAME: Mary Murillo, Mary Murillo MEDICAL RECORD NO: [id number] ACCOUNT NO: [id number] DATE OF BIRTH: August 21, 1951 FACILITY: [facility] LOCATION: [facility] PHYSICIAN: Pietro Cassis. Alvan Dame, MD  Operative Report   DATE OF PROCEDURE: 04/16/2021  PREOPERATIVE DIAGNOSIS:  Left knee osteoarthritis and pain associated with NICKEL ALLERGY.  POSTOPERATIVE DIAGNOSIS:  Left knee osteoarthritis and pain associated with NICKEL ALLERGY.  PROCEDURE:  Left total knee arthroplasty.  COMPONENTS USED:  Biomet Persona knee system with a size 6 narrow cruciate retaining nitrided femoral component, size E left Persona Titanium tibial baseplate, size 11 mm medial congruent polyethylene insert to match the size E tibia tray to match a size  6-7 femur.  A size 35 patellar button with 9 mm thickness.   SURGEON:  Pietro Cassis. Alvan Dame, MD  ASSISTANT:  Costella Hatcher, PA-C.  Note that Ms. Lu Duffel was present for the entirety of the case from preoperative positioning, perioperative management of the operative extremity, general facilitation of the case and primary wound closure.  ANESTHESIA:  Regional plus spinal.  TOURNIQUET:  Up for 33 minutes at 225 mmHg.  COMPLICATIONS:  None.  DRAINS:  None.  BLOOD LOSS:  Less than 100 mL  INDICATIONS FOR THE PROCEDURE:  The patient is a very pleasant 70 year old female with history of bilateral advanced knee osteoarthritis.  She has already successfully undergone a right total knee replacement and is here now for her left knee  replacement.  Risks and benefits, and postoperative course were reviewed.  Consent was obtained for benefit of pain relief.  PROCEDURE IN DETAIL: The patient was brought to the operative theater.  Once adequate anesthesia, preoperative antibiotics, Ancef administered as well as tranexamic acid and Decadron, she was positioned supine with a left thigh tourniquet placed.  The  left lower extremity was then prepped and draped in sterile fashion.  Timeout was performed identifying  the patient, the planned procedure, and extremity.  Leg was exsanguinated and tourniquet elevated to 225 mmHg.  Midline incision was made followed by  soft tissue exposure.  Medial arthrotomy was then made, encountering clear synovial fluid as a moderate effusion.  Following initial exposure of the medial and lateral aspect of the joint and synovectomy, we focused our attention on the patella.  Precut  measurement of the patella was noted to be 22 mm.  I resected it down to about 14 mm and we used a 35 patellar button.  Lug holes were drilled for this.  Button was placed on the patella to protect it from the saw blade and retractors throughout the  case.  Attention was now directed to the femur.  The femoral canal was opened with a drill, irrigated to try to prevent fat emboli.  An intramedullary rod was passed and at 5 degrees of valgus, 10 mm of bone was resected off the distal femur.  Following  this, resection of the tibia was subluxated anteriorly and using the extramedullary guide, 2 mm of bone was resected based off the proximal medial tibia.  We then assessed the gap that would be accepting of and balanced with a 10 mm gap.  Once this was  done and the cut was confirmed to be perpendicular in the coronal plane, I sized the femur to be a size 6 as it was on the contralateral knee.  This block was pinned in position and the anterior, posterior and chamfer cuts were then all made without  difficulty nor notching.  The trial 6 component was then impacted onto the distal femur and the tibia  subluxated anteriorly.  The cut surface again matched the size E tibial baseplate.  It was pinned in place, then drilled and keel punched.  We did a  trial reduction first with a 10 mm, but then 11 mm insert and found that knee came to full extension and flexed with the patella tracking through the trochlea with excellent range of motion.  Given these findings, all the trial components were removed.   We drilled  sclerotic bone on the proximal medial tibia.  The knee was injected in the synovial capsular junction with 0.25% Marcaine with epinephrine, 1 mL of Toradol and saline.  The knee was then irrigated with normal saline solution as the final  components were opened and cement was mixed.  The final components were then cemented on a clean and dried cut surface of the bone.  The knee was brought to extension with an 11 mm insert.  Once all the components were in place, the tourniquet was let  down after 33 minutes.  There was no significant hemostasis required.  Once the cement fully cured, excessive cement was removed from out the knee.  Based on the trial reductions and assessment of motion and ligament balancing, the size 11 mm insert was  selected.  It was then snapped into position on the tray and the knee reduced.  The knee was reirrigated with normal saline solution.  The extensor mechanism was then reapproximated with the knee in 60 degrees of flexion using #1 Vicryl and #1 Stratafix  suture.  The remainder of the wound was closed in layers with 2-0 Vicryl and a running Monocryl stitch.  The knee was then cleaned, dried and dressed sterilely using surgical glue and Aquacel dressing.  The patient was then brought to the recovery room  in stable condition, tolerating the procedure well.  Findings were reviewed with her husband.  She would like to be discharged from the recovery room today with physical therapy to match her prior knee replacement.   PAA D: 04/16/2021 8:59:52 am T: 04/16/2021 9:29:00 am  JOB: [id number]/ [id number]

## 2021-04-17 ENCOUNTER — Encounter (HOSPITAL_COMMUNITY): Payer: Self-pay | Admitting: Orthopedic Surgery

## 2021-04-19 DIAGNOSIS — M25562 Pain in left knee: Secondary | ICD-10-CM | POA: Diagnosis not present

## 2021-04-21 ENCOUNTER — Encounter (HOSPITAL_COMMUNITY): Payer: Self-pay | Admitting: Orthopedic Surgery

## 2021-04-21 DIAGNOSIS — Z79890 Hormone replacement therapy (postmenopausal): Secondary | ICD-10-CM | POA: Diagnosis not present

## 2021-04-21 DIAGNOSIS — E039 Hypothyroidism, unspecified: Secondary | ICD-10-CM | POA: Diagnosis not present

## 2021-04-21 DIAGNOSIS — Z87891 Personal history of nicotine dependence: Secondary | ICD-10-CM | POA: Diagnosis not present

## 2021-04-21 DIAGNOSIS — Z79899 Other long term (current) drug therapy: Secondary | ICD-10-CM | POA: Diagnosis not present

## 2021-04-21 DIAGNOSIS — M1712 Unilateral primary osteoarthritis, left knee: Secondary | ICD-10-CM | POA: Diagnosis not present

## 2021-04-21 MED ORDER — ROPIVACAINE HCL 7.5 MG/ML IJ SOLN
INTRAMUSCULAR | Status: DC | PRN
  Administered 2021-04-21: 20 mL via PERINEURAL

## 2021-04-21 NOTE — Anesthesia Procedure Notes (Signed)
Anesthesia Regional Block: Adductor canal block   Pre-Anesthetic Checklist: , timeout performed,  Correct Patient, Correct Site, Correct Laterality,  Correct Procedure, Correct Position, site marked,  Risks and benefits discussed,  Surgical consent,  Pre-op evaluation,  At surgeon's request and post-op pain management  Laterality: Left and Lower  Prep: chloraprep       Needles:  Injection technique: Single-shot      Needle Length: 9cm  Needle Gauge: 22     Additional Needles: Arrow StimuQuik ECHO Echogenic Stimulating PNB Needle  Procedures:,,,, ultrasound used (permanent image in chart),,    Narrative:  Start time: 04/16/2021 7:22 AM End time: 04/16/2021 7:27 AM Injection made incrementally with aspirations every 5 mL.  Performed by: Personally  Anesthesiologist: Oleta Mouse, MD

## 2021-04-21 NOTE — Anesthesia Postprocedure Evaluation (Signed)
Anesthesia Post Note  Patient: Mary Murillo  Procedure(s) Performed: TOTAL KNEE ARTHROPLASTY (Left: Knee)     Patient location during evaluation: PACU Anesthesia Type: Regional, MAC and Spinal Level of consciousness: awake and alert Pain management: pain level controlled Vital Signs Assessment: post-procedure vital signs reviewed and stable Respiratory status: spontaneous breathing, nonlabored ventilation, respiratory function stable and patient connected to nasal cannula oxygen Cardiovascular status: stable and blood pressure returned to baseline Postop Assessment: no apparent nausea or vomiting Anesthetic complications: no   No notable events documented.  Last Vitals:  Vitals:   04/16/21 1100 04/16/21 1248  BP: 117/71 108/72  Pulse: 88 86  Resp: 14 16  Temp:  36.5 C  SpO2: 95% 94%    Last Pain:  Vitals:   04/16/21 1248  TempSrc:   PainSc: 0-No pain                 Kylieann Eagles

## 2021-04-22 DIAGNOSIS — M25562 Pain in left knee: Secondary | ICD-10-CM | POA: Diagnosis not present

## 2021-04-24 DIAGNOSIS — M25562 Pain in left knee: Secondary | ICD-10-CM | POA: Diagnosis not present

## 2021-04-26 DIAGNOSIS — M25562 Pain in left knee: Secondary | ICD-10-CM | POA: Diagnosis not present

## 2021-04-29 DIAGNOSIS — M25562 Pain in left knee: Secondary | ICD-10-CM | POA: Diagnosis not present

## 2021-05-01 DIAGNOSIS — M25562 Pain in left knee: Secondary | ICD-10-CM | POA: Diagnosis not present

## 2021-05-07 DIAGNOSIS — M25562 Pain in left knee: Secondary | ICD-10-CM | POA: Diagnosis not present

## 2021-05-09 DIAGNOSIS — M25562 Pain in left knee: Secondary | ICD-10-CM | POA: Diagnosis not present

## 2021-05-13 DIAGNOSIS — M25562 Pain in left knee: Secondary | ICD-10-CM | POA: Diagnosis not present

## 2021-05-15 DIAGNOSIS — M25562 Pain in left knee: Secondary | ICD-10-CM | POA: Diagnosis not present

## 2021-05-21 DIAGNOSIS — M25562 Pain in left knee: Secondary | ICD-10-CM | POA: Diagnosis not present

## 2021-05-24 DIAGNOSIS — M25562 Pain in left knee: Secondary | ICD-10-CM | POA: Diagnosis not present

## 2021-05-28 DIAGNOSIS — M25562 Pain in left knee: Secondary | ICD-10-CM | POA: Diagnosis not present

## 2021-05-31 DIAGNOSIS — M25562 Pain in left knee: Secondary | ICD-10-CM | POA: Diagnosis not present

## 2021-05-31 DIAGNOSIS — Z96652 Presence of left artificial knee joint: Secondary | ICD-10-CM | POA: Diagnosis not present

## 2021-05-31 DIAGNOSIS — Z471 Aftercare following joint replacement surgery: Secondary | ICD-10-CM | POA: Diagnosis not present

## 2021-06-04 DIAGNOSIS — M25562 Pain in left knee: Secondary | ICD-10-CM | POA: Diagnosis not present

## 2021-06-07 DIAGNOSIS — M25562 Pain in left knee: Secondary | ICD-10-CM | POA: Diagnosis not present

## 2021-06-17 DIAGNOSIS — M25562 Pain in left knee: Secondary | ICD-10-CM | POA: Diagnosis not present

## 2021-06-23 IMAGING — MR MR KNEE*R* W/O CM
6 series · 37 of 40 positions shown · non-contrast
Comparison: 04/13/2020

CLINICAL DATA: Generalized right knee pain and swelling for several
years

EXAM:
MRI OF THE RIGHT KNEE WITHOUT CONTRAST
TECHNIQUE: Multiplanar, multisequence MR imaging of the knee was performed. No
intravenous contrast was administered.

[Series 6: T2 fat-sat · axial · right · 4.0mm · 0.50mm/px · z∈[-46,+107]mm · 9 of 36 slices shown (1 of 3)]
[im 1/36]
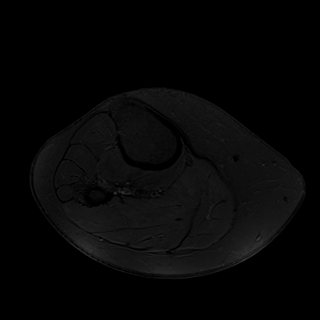
[im 5/36]
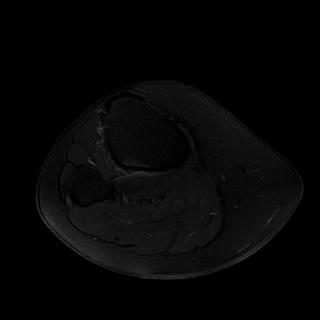
[im 9/36]
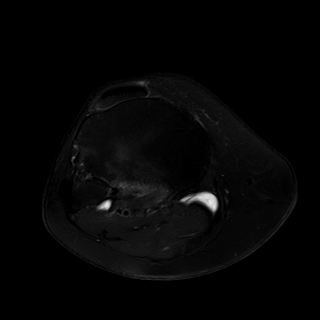
[im 14/36]
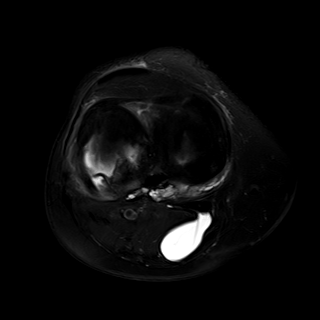
[im 18/36]
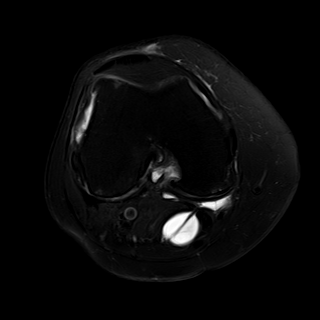
[im 22/36]
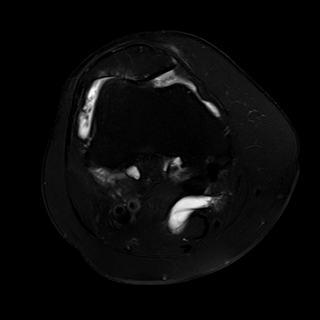
[im 27/36]
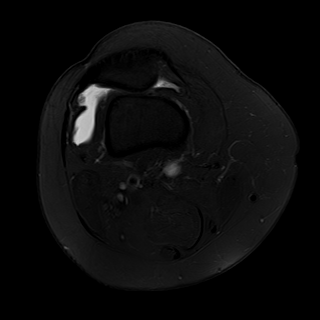
[im 31/36]
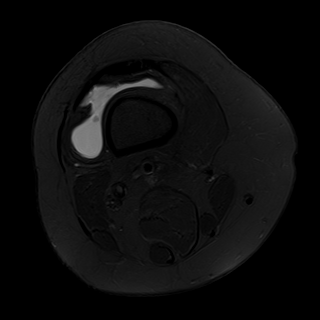
[im 36/36]
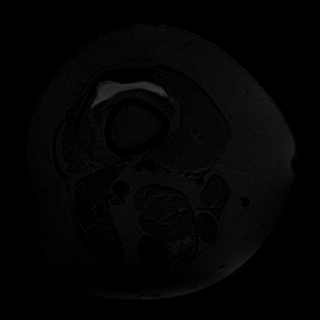

[Series 7: T2 fat-sat · coronal · right · 4.0mm · 0.39mm/px · 6 of 28 slices shown (2 of 3)]
[im 1/28]
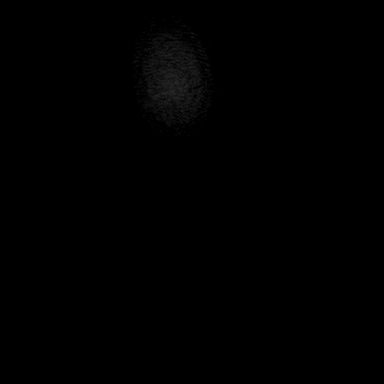
[im 6/28]
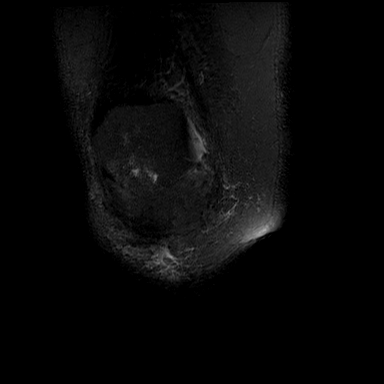
[im 11/28]
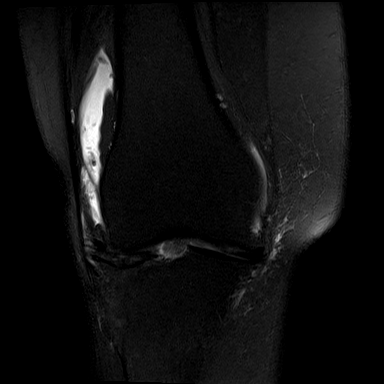
[im 17/28]
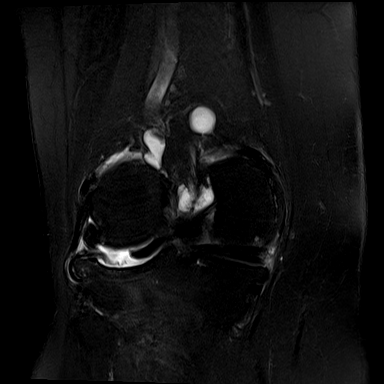
[im 22/28]
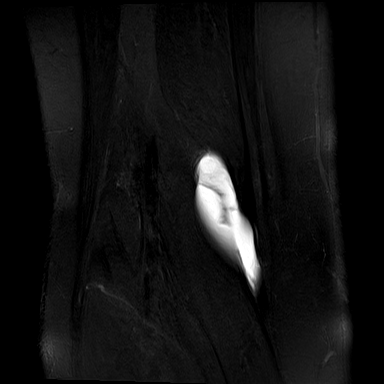
[im 28/28]
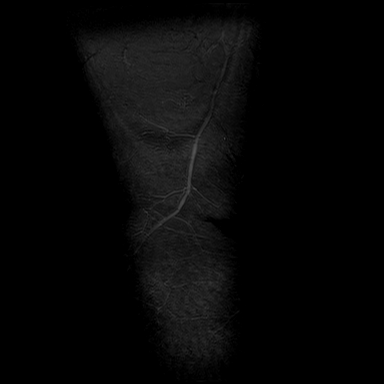

[Series 8: T1 · coronal · right · 4.0mm · 0.39mm/px · 3 of 28 slices shown]
[im 1/28]
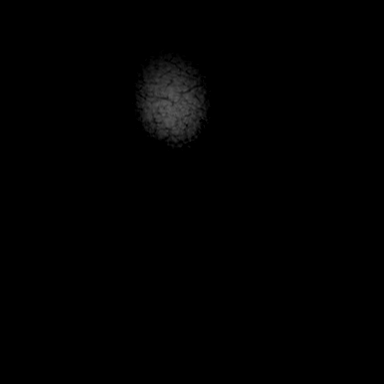
[im 6/28]
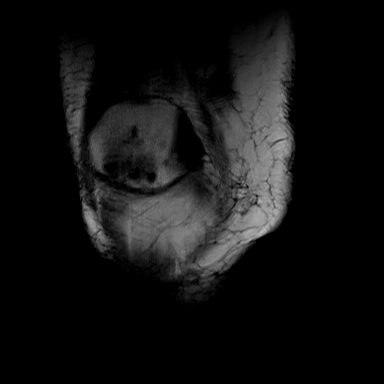
[im 11/28]
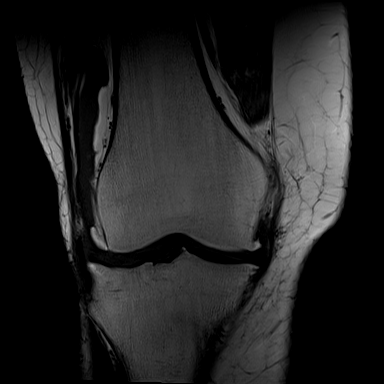

[Series 9: PD fat-sat · coronal · right · 3.0mm · 0.47mm/px · 7 of 34 slices shown (1 of 2)]
[im 1/34]
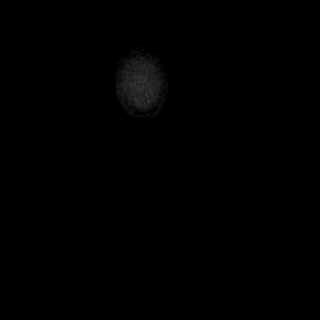
[im 6/34]
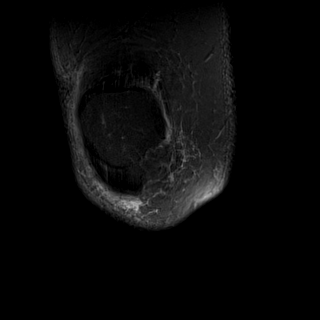
[im 12/34]
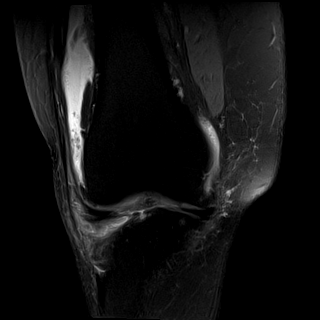
[im 17/34]
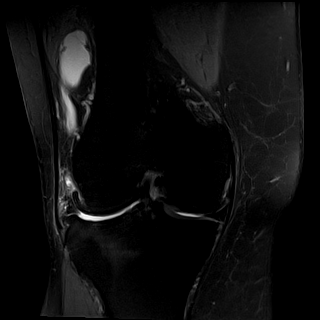
[im 23/34]
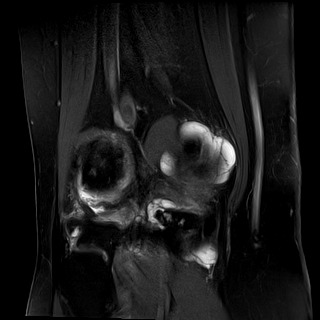
[im 28/34]
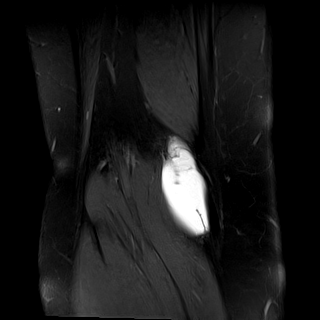
[im 34/34]
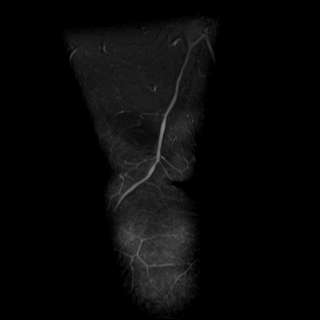

[Series 10: PD fat-sat · sagittal · right · 3.0mm · 0.39mm/px · 6 of 28 slices shown (2 of 2)]
[im 1/28]
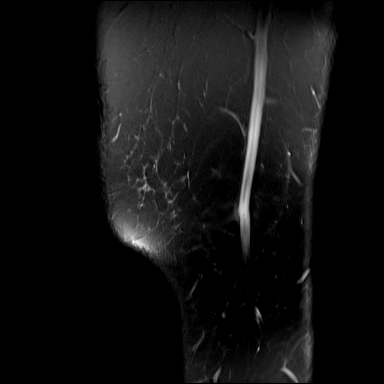
[im 6/28]
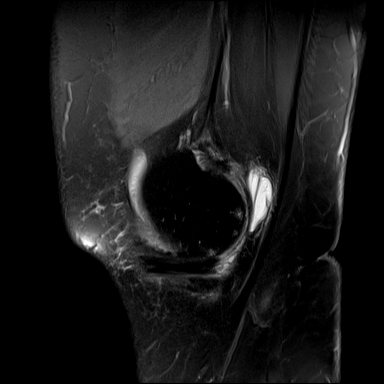
[im 11/28]
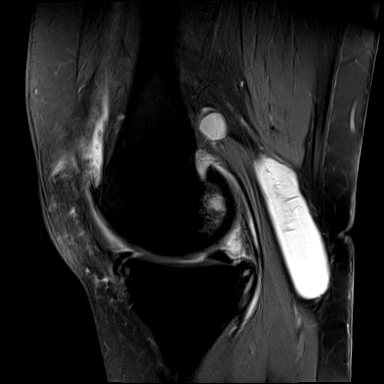
[im 17/28]
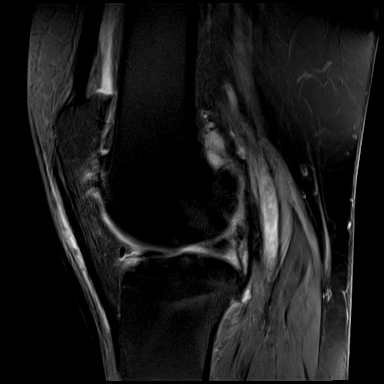
[im 22/28]
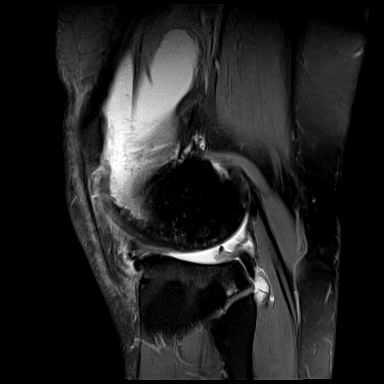
[im 28/28]
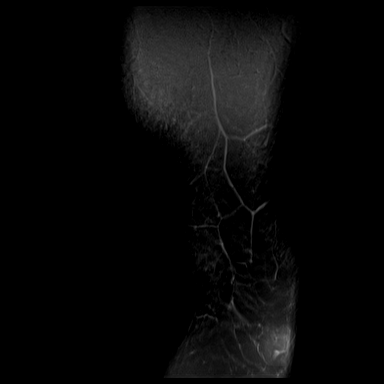

[Series 11: T2 fat-sat · sagittal · right · 3.0mm · 0.39mm/px · 6 of 28 slices shown (3 of 3)]
[im 1/28]
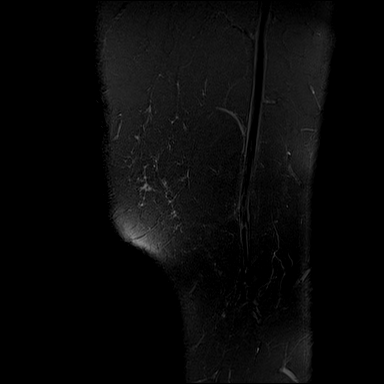
[im 6/28]
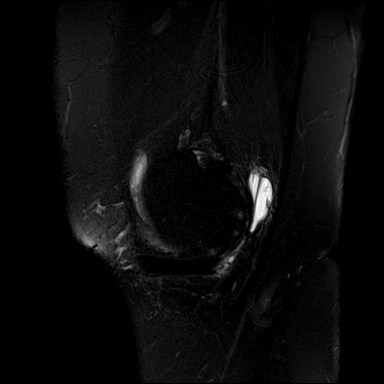
[im 11/28]
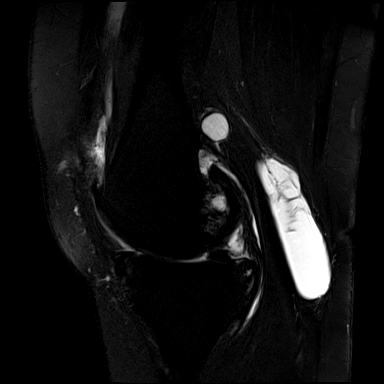
[im 17/28]
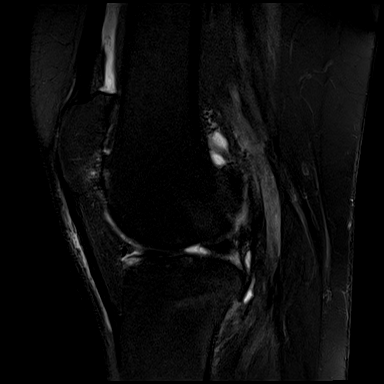
[im 22/28]
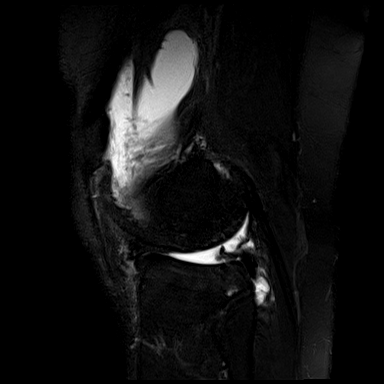
[im 28/28]
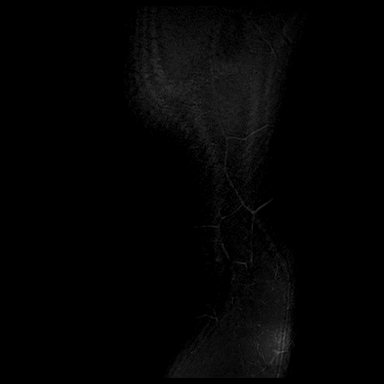

[37 of 40 positions shown; findings below may reference images not displayed]

FINDINGS: MENISCI

Medial meniscus: Intrasubstance degeneration without a well-defined
tear.

Lateral meniscus: Complete radial tear of the midportion of the
posterior horn (series 9, image 14) with maceration and complex
tearing of the body segment.

LIGAMENTS

Cruciates:  Intact ACL and PCL.

Collaterals: Medial collateral ligament is intact. Lateral
collateral ligament complex is intact.

CARTILAGE

Patellofemoral: Full-thickness cartilage loss of the mid to inferior
portions of the lateral patellar facet and associated lateral
trochlea with subchondral cystic changes.

Medial: Moderate chondral thinning with scattered areas of
high-grade chondral loss of the medial femoral condyle (series 9,
images 17 and 20).

Lateral: Extensive full-thickness cartilage loss of the
weight-bearing lateral compartment.

Joint: Moderate knee joint effusion. Fat pads within normal limits.

Popliteal Fossa: Moderate-sized Baker's cyst measuring up to 6.0 cm
in length. Intact popliteus tendon.

Extensor Mechanism:  Intact quadriceps tendon and patellar tendon.

Bones: Tricompartmental joint space narrowing with marginal
osteophyte formation. Degenerative subchondral marrow signal changes
is most pronounced in the patellofemoral compartment.

Other: None.
IMPRESSION: 1. Complete radial tear of the midportion of the posterior horn of
the lateral meniscus with maceration and complex tearing of the body
segment.
2. Tricompartmental osteoarthritis, severe in the lateral and
patellofemoral compartments.
3. Moderate knee joint effusion.
4. Moderate-sized Baker's cyst.

## 2021-06-25 ENCOUNTER — Other Ambulatory Visit: Payer: Self-pay | Admitting: Internal Medicine

## 2021-06-25 DIAGNOSIS — Z1231 Encounter for screening mammogram for malignant neoplasm of breast: Secondary | ICD-10-CM

## 2021-06-26 ENCOUNTER — Ambulatory Visit (INDEPENDENT_AMBULATORY_CARE_PROVIDER_SITE_OTHER): Payer: Medicare PPO

## 2021-06-26 ENCOUNTER — Other Ambulatory Visit: Payer: Self-pay

## 2021-06-26 DIAGNOSIS — Z1231 Encounter for screening mammogram for malignant neoplasm of breast: Secondary | ICD-10-CM

## 2021-08-16 ENCOUNTER — Other Ambulatory Visit: Payer: Medicare PPO | Admitting: Internal Medicine

## 2021-08-16 ENCOUNTER — Other Ambulatory Visit: Payer: Self-pay

## 2021-08-16 DIAGNOSIS — E039 Hypothyroidism, unspecified: Secondary | ICD-10-CM

## 2021-08-16 DIAGNOSIS — Z0000 Encounter for general adult medical examination without abnormal findings: Secondary | ICD-10-CM

## 2021-08-16 DIAGNOSIS — E7800 Pure hypercholesterolemia, unspecified: Secondary | ICD-10-CM

## 2021-08-17 LAB — COMPLETE METABOLIC PANEL WITH GFR
AG Ratio: 1.4 (calc) (ref 1.0–2.5)
ALT: 19 U/L (ref 6–29)
AST: 18 U/L (ref 10–35)
Albumin: 4 g/dL (ref 3.6–5.1)
Alkaline phosphatase (APISO): 108 U/L (ref 37–153)
BUN: 12 mg/dL (ref 7–25)
CO2: 28 mmol/L (ref 20–32)
Calcium: 10.2 mg/dL (ref 8.6–10.4)
Chloride: 109 mmol/L (ref 98–110)
Creat: 0.83 mg/dL (ref 0.60–1.00)
Globulin: 2.9 g/dL (calc) (ref 1.9–3.7)
Glucose, Bld: 94 mg/dL (ref 65–99)
Potassium: 5 mmol/L (ref 3.5–5.3)
Sodium: 144 mmol/L (ref 135–146)
Total Bilirubin: 0.8 mg/dL (ref 0.2–1.2)
Total Protein: 6.9 g/dL (ref 6.1–8.1)
eGFR: 75 mL/min/{1.73_m2} (ref >=60)

## 2021-08-17 LAB — CBC WITH DIFFERENTIAL/PLATELET
Absolute Monocytes: 437 cells/uL (ref 200–950)
Basophils Absolute: 29 cells/uL (ref 0–200)
Basophils Relative: 0.6 %
Eosinophils Absolute: 72 cells/uL (ref 15–500)
Eosinophils Relative: 1.5 %
HCT: 44.5 % (ref 35.0–45.0)
Hemoglobin: 14.2 g/dL (ref 11.7–15.5)
Lymphs Abs: 1886 cells/uL (ref 850–3900)
MCH: 29.3 pg (ref 27.0–33.0)
MCHC: 31.9 g/dL — ABNORMAL LOW (ref 32.0–36.0)
MCV: 91.9 fL (ref 80.0–100.0)
MPV: 10 fL (ref 7.5–12.5)
Monocytes Relative: 9.1 %
Neutro Abs: 2376 cells/uL (ref 1500–7800)
Neutrophils Relative %: 49.5 %
Platelets: 363 10*3/uL (ref 140–400)
RBC: 4.84 10*6/uL (ref 3.80–5.10)
RDW: 13.6 % (ref 11.0–15.0)
Total Lymphocyte: 39.3 %
WBC: 4.8 10*3/uL (ref 3.8–10.8)

## 2021-08-17 LAB — LIPID PANEL
Cholesterol: 216 mg/dL — ABNORMAL HIGH (ref <200)
HDL: 61 mg/dL (ref >=50)
LDL Cholesterol (Calc): 134 mg/dL (calc) — ABNORMAL HIGH
Non-HDL Cholesterol (Calc): 155 mg/dL (calc) — ABNORMAL HIGH (ref <130)
Total CHOL/HDL Ratio: 3.5 (calc) (ref <5.0)
Triglycerides: 99 mg/dL (ref <150)

## 2021-08-17 LAB — TSH: TSH: 2.7 mIU/L (ref 0.40–4.50)

## 2021-08-19 ENCOUNTER — Ambulatory Visit (INDEPENDENT_AMBULATORY_CARE_PROVIDER_SITE_OTHER): Payer: Medicare PPO | Admitting: Internal Medicine

## 2021-08-19 ENCOUNTER — Encounter: Payer: Self-pay | Admitting: Internal Medicine

## 2021-08-19 ENCOUNTER — Other Ambulatory Visit: Payer: Self-pay

## 2021-08-19 VITALS — BP 122/88 | HR 71 | Temp 98.1°F | Ht 63.0 in | Wt 182.0 lb

## 2021-08-19 DIAGNOSIS — E7800 Pure hypercholesterolemia, unspecified: Secondary | ICD-10-CM | POA: Diagnosis not present

## 2021-08-19 DIAGNOSIS — Z8249 Family history of ischemic heart disease and other diseases of the circulatory system: Secondary | ICD-10-CM | POA: Diagnosis not present

## 2021-08-19 DIAGNOSIS — E039 Hypothyroidism, unspecified: Secondary | ICD-10-CM

## 2021-08-19 DIAGNOSIS — Z0000 Encounter for general adult medical examination without abnormal findings: Secondary | ICD-10-CM | POA: Diagnosis not present

## 2021-08-19 DIAGNOSIS — Z8659 Personal history of other mental and behavioral disorders: Secondary | ICD-10-CM | POA: Diagnosis not present

## 2021-08-19 LAB — POCT URINALYSIS DIPSTICK
Bilirubin, UA: NEGATIVE
Blood, UA: NEGATIVE
Glucose, UA: NEGATIVE
Ketones, UA: NEGATIVE
Leukocytes, UA: NEGATIVE
Nitrite, UA: NEGATIVE
Protein, UA: NEGATIVE
Spec Grav, UA: <=1.005 — AB (ref 1.010–1.025)
Urobilinogen, UA: NEGATIVE E.U./dL — AB
pH, UA: 7 (ref 5.0–8.0)

## 2021-08-19 NOTE — Patient Instructions (Addendum)
Have coronary calcium score done.  Continue current medications and follow-up in 1 year or as needed.

## 2021-08-19 NOTE — Progress Notes (Signed)
Subjective:   Patient presents for Medicare Annual/Subsequent preventive examination.  Risk Factors  Current exercise habits: regular- very active Dietary issues discussed: yes  Cardiac risk factors:advanced age (older than 8 for men, 79 for women)  Depression Screen  (Note: if answer to either of the following is "Yes", a more complete depression screening is indicated)   Over the past two weeks, have you felt down, depressed or hopeless? No Over the past two weeks, have you felt little interest or pleasure in doing things? No Have you lost interest or pleasure in daily life? No Do you often feel hopeless? No Do you cry easily over simple problems? No  Activities of Daily Living  In your present state of health, do you have any difficulty performing the following activities?:   Driving? No Managing money? No Feeding yourself? No Getting from bed to chair?No Climbing a flight of stairs?  No Preparing food and eating?:  No Bathing or showering?   No Getting dressed:  No Getting to the toilet?  No Using the toilet:  No Moving around from place to place:  No In the past year have you fallen or had a near fall?  No Are you sexually active?  Yes Do you have more than one partner?  No  Hearing Difficulties:    Do you often ask people to speak up or repeat themselves?  Yes Do you experience ringing or noises in your ears?   Yes Do you have difficulty understanding soft or whispered voices?  No Do you feel that you have a problem with memory?  No  Do you often misplace items?  No   Home Safety:  Do you have a smoke alarm at your residence? Yes Do you have grab bars in the bathroom?  No Do you have throw rugs in your house?   Yes   Cognitive Testing  Alert? Yes Normal Appearance?Yes  Oriented to person? Yes Place? Yes  Time? Yes  Recall of three objects? Yes  Can perform simple calculations? Yes  Displays appropriate judgment?Yes  Can read the correct time from a  watch face?Yes   List the Names of Other Physician/Practitioners you currently use:  See referral list for the physicians patient is currently seeing.    Patient Instructions (the written plan) was given to the patient.  Medicare Attestation  I have personally reviewed:  The patient's medical and social history  Their use of alcohol, tobacco or illicit drugs  Their current medications and supplements  The patient's functional ability including ADLs,fall risks, home safety risks, cognitive, and hearing and visual impairment  Diet and physical activities  Evidence for depression or mood disorders  The patient's weight, height, BMI, and visual acuity have been recorded in the chart. I have made referrals, counseling, and provided education to the patient based on review of the above and I have provided the patient with a written personalized care plan for preventive services.   70 year old Female seen for Medicare wellness and health maintenance exam for evaluation of medical issues.  History of hyperlipidemia.  Took Crestor for short time but subsequently discontinued it.  History of asthma, allergic rhinitis, history of recurrent urinary infections but none recently, history of vitamin D deficiency.  History of Candida vaginal infections related to being on antibiotics.  History of hypothyroidism and mild depression.  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  History of thrombosed hemorrhoid 2004.  History of tonsillectomy and adenoidectomy 1955-02-08.  C-section 07-Feb-1981.  Oral mass removed from under her tongue in 07-Feb-2002.  Hysterectomy without oophorectomy 1991.  Colonoscopy done by Dr. Olevia Perches in February 07, 2010 with 10-year follow-up recommended.  Repeat study done October 2021 with adenomatous polyps being removed and 3-year follow-up recommended by Dr. Tarri Glenn.  History of bilateral knee osteoarthritis.  Social history: Married with 2  adult daughters.  Does not smoke.  Social alcohol consumption.  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  Husband was a Pharmacist, hospital at [employer] day school but has retired.  He has a history of colon cancer.  Family history: 1 sister died of metastatic breast cancer.  Parents died in 02/07/2018.  Both parents had dementia.  Father had history of strokes.  Daughter with history of Lyme disease.  Currently takes Celebrex 200 mg twice daily for musculoskeletal pain.  She has pure hypercholesterolemia with total cholesterol 216 and LDL cholesterol 134 but does not want to be on statin medication.  She is on thyroid replacement medication and TSH is normal.  Blood pressure 122/88 pulse 71 regular temperature 98.1 degrees pulse oximetry 96% weight 182 pounds BMI 32.24   Skin: Warm and dry.  Nodes none.  Neck supple.  No thyromegaly JVD or carotid bruits.  Chest clear to auscultation.  Cardiac exam: Regular rate and rhythm without ectopy.  Breasts are without masses.  Abdomen is soft nondistended without hepatosplenomegaly, masses or tenderness.  No lower extremity pitting edema.  Neuro is intact without gross focal deficits.  Affect thought and judgment are normal.  Impression:  Pure hypercholesterolemia-does not want to be on statin medication.  Continue to work on diet and exercise.  Hypothyroidism-TSH is within normal limits on low-dose thyroid medication  History of depression stable on Lexapro  Osteoarthritis of knees patient currently takes Celebrex  Plan: Return in 1 year or as needed.  Had COVID booster in September.  Has had pneumococcal vaccines.  Tetanus immunization is up-to-date.  Has not had Shingrix vaccine but did have 1 zoster vaccine in Feb 08, 2011.  Recommend flu vaccine this season.  Since she does not want to be on statin medication I recommended coronary calcium score.  Order was placed.

## 2021-08-20 ENCOUNTER — Other Ambulatory Visit: Payer: Self-pay | Admitting: Internal Medicine

## 2021-08-28 ENCOUNTER — Other Ambulatory Visit: Payer: Self-pay

## 2021-08-28 DIAGNOSIS — Z8249 Family history of ischemic heart disease and other diseases of the circulatory system: Secondary | ICD-10-CM

## 2021-08-28 DIAGNOSIS — E7800 Pure hypercholesterolemia, unspecified: Secondary | ICD-10-CM

## 2021-08-30 ENCOUNTER — Other Ambulatory Visit: Payer: Self-pay

## 2021-08-30 ENCOUNTER — Ambulatory Visit (HOSPITAL_COMMUNITY)
Admission: RE | Admit: 2021-08-30 | Discharge: 2021-08-30 | Disposition: A | Discharge status: Home / Self Care | Payer: Self-pay | Source: Ambulatory Visit | Attending: Internal Medicine | Admitting: Internal Medicine

## 2021-08-30 ENCOUNTER — Ambulatory Visit (HOSPITAL_COMMUNITY): Payer: Medicare PPO

## 2021-08-30 DIAGNOSIS — Z8249 Family history of ischemic heart disease and other diseases of the circulatory system: Secondary | ICD-10-CM | POA: Insufficient documentation

## 2021-08-30 DIAGNOSIS — E7800 Pure hypercholesterolemia, unspecified: Secondary | ICD-10-CM | POA: Insufficient documentation

## 2022-03-11 DIAGNOSIS — D225 Melanocytic nevi of trunk: Secondary | ICD-10-CM | POA: Diagnosis not present

## 2022-03-11 DIAGNOSIS — D1801 Hemangioma of skin and subcutaneous tissue: Secondary | ICD-10-CM | POA: Diagnosis not present

## 2022-03-11 DIAGNOSIS — L821 Other seborrheic keratosis: Secondary | ICD-10-CM | POA: Diagnosis not present

## 2022-03-11 DIAGNOSIS — L814 Other melanin hyperpigmentation: Secondary | ICD-10-CM | POA: Diagnosis not present

## 2022-03-11 DIAGNOSIS — L570 Actinic keratosis: Secondary | ICD-10-CM | POA: Diagnosis not present

## 2022-04-14 DIAGNOSIS — Z96653 Presence of artificial knee joint, bilateral: Secondary | ICD-10-CM | POA: Diagnosis not present

## 2022-04-14 DIAGNOSIS — Z471 Aftercare following joint replacement surgery: Secondary | ICD-10-CM | POA: Diagnosis not present

## 2022-05-19 ENCOUNTER — Other Ambulatory Visit: Payer: Self-pay | Admitting: Internal Medicine

## 2022-05-23 ENCOUNTER — Other Ambulatory Visit: Payer: Self-pay | Admitting: Internal Medicine

## 2022-05-23 DIAGNOSIS — Z1231 Encounter for screening mammogram for malignant neoplasm of breast: Secondary | ICD-10-CM

## 2022-07-03 ENCOUNTER — Ambulatory Visit (INDEPENDENT_AMBULATORY_CARE_PROVIDER_SITE_OTHER): Payer: Medicare PPO

## 2022-07-03 DIAGNOSIS — Z1231 Encounter for screening mammogram for malignant neoplasm of breast: Secondary | ICD-10-CM

## 2022-07-28 DIAGNOSIS — H903 Sensorineural hearing loss, bilateral: Secondary | ICD-10-CM | POA: Diagnosis not present

## 2022-08-12 DIAGNOSIS — H524 Presbyopia: Secondary | ICD-10-CM | POA: Diagnosis not present

## 2022-08-12 DIAGNOSIS — H2513 Age-related nuclear cataract, bilateral: Secondary | ICD-10-CM | POA: Diagnosis not present

## 2022-08-12 DIAGNOSIS — H40053 Ocular hypertension, bilateral: Secondary | ICD-10-CM | POA: Diagnosis not present

## 2022-08-12 DIAGNOSIS — H5203 Hypermetropia, bilateral: Secondary | ICD-10-CM | POA: Diagnosis not present

## 2022-08-12 DIAGNOSIS — H40013 Open angle with borderline findings, low risk, bilateral: Secondary | ICD-10-CM | POA: Diagnosis not present

## 2022-08-12 DIAGNOSIS — H52223 Regular astigmatism, bilateral: Secondary | ICD-10-CM | POA: Diagnosis not present

## 2022-08-15 ENCOUNTER — Other Ambulatory Visit: Payer: Medicare PPO

## 2022-08-15 DIAGNOSIS — Z8659 Personal history of other mental and behavioral disorders: Secondary | ICD-10-CM

## 2022-08-15 DIAGNOSIS — E039 Hypothyroidism, unspecified: Secondary | ICD-10-CM | POA: Diagnosis not present

## 2022-08-15 DIAGNOSIS — E7800 Pure hypercholesterolemia, unspecified: Secondary | ICD-10-CM

## 2022-08-16 LAB — COMPLETE METABOLIC PANEL WITH GFR
AG Ratio: 1.3 (calc) (ref 1.0–2.5)
ALT: 20 U/L (ref 6–29)
AST: 21 U/L (ref 10–35)
Albumin: 3.9 g/dL (ref 3.6–5.1)
Alkaline phosphatase (APISO): 88 U/L (ref 37–153)
BUN: 10 mg/dL (ref 7–25)
CO2: 26 mmol/L (ref 20–32)
Calcium: 9.8 mg/dL (ref 8.6–10.4)
Chloride: 110 mmol/L (ref 98–110)
Creat: 0.84 mg/dL (ref 0.60–1.00)
Globulin: 2.9 g/dL (calc) (ref 1.9–3.7)
Glucose, Bld: 92 mg/dL (ref 65–99)
Potassium: 5.2 mmol/L (ref 3.5–5.3)
Sodium: 142 mmol/L (ref 135–146)
Total Bilirubin: 1.1 mg/dL (ref 0.2–1.2)
Total Protein: 6.8 g/dL (ref 6.1–8.1)
eGFR: 74 mL/min/{1.73_m2} (ref >=60)

## 2022-08-16 LAB — LIPID PANEL
Cholesterol: 229 mg/dL — ABNORMAL HIGH (ref <200)
HDL: 48 mg/dL — ABNORMAL LOW (ref >=50)
LDL Cholesterol (Calc): 154 mg/dL (calc) — ABNORMAL HIGH
Non-HDL Cholesterol (Calc): 181 mg/dL (calc) — ABNORMAL HIGH (ref <130)
Total CHOL/HDL Ratio: 4.8 (calc) (ref <5.0)
Triglycerides: 143 mg/dL (ref <150)

## 2022-08-16 LAB — CBC WITH DIFFERENTIAL/PLATELET
Absolute Monocytes: 566 cells/uL (ref 200–950)
Basophils Absolute: 20 cells/uL (ref 0–200)
Basophils Relative: 0.4 %
Eosinophils Absolute: 102 cells/uL (ref 15–500)
Eosinophils Relative: 2 %
HCT: 42 % (ref 35.0–45.0)
Hemoglobin: 14.3 g/dL (ref 11.7–15.5)
Lymphs Abs: 1958 cells/uL (ref 850–3900)
MCH: 31.5 pg (ref 27.0–33.0)
MCHC: 34 g/dL (ref 32.0–36.0)
MCV: 92.5 fL (ref 80.0–100.0)
MPV: 9.7 fL (ref 7.5–12.5)
Monocytes Relative: 11.1 %
Neutro Abs: 2453 cells/uL (ref 1500–7800)
Neutrophils Relative %: 48.1 %
Platelets: 333 10*3/uL (ref 140–400)
RBC: 4.54 10*6/uL (ref 3.80–5.10)
RDW: 12.9 % (ref 11.0–15.0)
Total Lymphocyte: 38.4 %
WBC: 5.1 10*3/uL (ref 3.8–10.8)

## 2022-08-16 LAB — TSH: TSH: 2.69 mIU/L (ref 0.40–4.50)

## 2022-08-19 ENCOUNTER — Other Ambulatory Visit: Payer: Self-pay | Admitting: Internal Medicine

## 2022-08-19 ENCOUNTER — Encounter: Payer: Self-pay | Admitting: Internal Medicine

## 2022-08-19 ENCOUNTER — Ambulatory Visit: Payer: Medicare PPO | Admitting: Internal Medicine

## 2022-08-19 ENCOUNTER — Other Ambulatory Visit: Payer: Medicare PPO

## 2022-08-19 DIAGNOSIS — Z8249 Family history of ischemic heart disease and other diseases of the circulatory system: Secondary | ICD-10-CM | POA: Diagnosis not present

## 2022-08-19 DIAGNOSIS — Z23 Encounter for immunization: Secondary | ICD-10-CM | POA: Diagnosis not present

## 2022-08-19 DIAGNOSIS — Z7185 Encounter for immunization safety counseling: Secondary | ICD-10-CM

## 2022-08-19 DIAGNOSIS — Z96652 Presence of left artificial knee joint: Secondary | ICD-10-CM

## 2022-08-19 DIAGNOSIS — E039 Hypothyroidism, unspecified: Secondary | ICD-10-CM

## 2022-08-19 DIAGNOSIS — Z0000 Encounter for general adult medical examination without abnormal findings: Secondary | ICD-10-CM | POA: Diagnosis not present

## 2022-08-19 DIAGNOSIS — E7800 Pure hypercholesterolemia, unspecified: Secondary | ICD-10-CM

## 2022-08-19 DIAGNOSIS — Z8659 Personal history of other mental and behavioral disorders: Secondary | ICD-10-CM

## 2022-08-19 DIAGNOSIS — M1711 Unilateral primary osteoarthritis, right knee: Secondary | ICD-10-CM

## 2022-08-19 DIAGNOSIS — Z789 Other specified health status: Secondary | ICD-10-CM | POA: Diagnosis not present

## 2022-08-19 LAB — POCT URINALYSIS DIPSTICK
Bilirubin, UA: NEGATIVE
Blood, UA: POSITIVE
Glucose, UA: NEGATIVE
Ketones, UA: NEGATIVE
Leukocytes, UA: NEGATIVE
Nitrite, UA: NEGATIVE
Protein, UA: NEGATIVE
Spec Grav, UA: 1.01 (ref 1.010–1.025)
Urobilinogen, UA: 0.2 E.U./dL
pH, UA: 8 (ref 5.0–8.0)

## 2022-08-19 MED ORDER — ROSUVASTATIN CALCIUM 5 MG PO TABS: 5.0000 mg | ORAL_TABLET | Freq: Every day | ORAL | 3 refills | Status: DC

## 2022-08-19 MED ORDER — CELECOXIB 200 MG PO CAPS: 200.0000 mg | ORAL_CAPSULE | Freq: Two times a day (BID) | ORAL | 3 refills | Status: DC

## 2022-08-19 NOTE — Progress Notes (Signed)
   Subjective:    Patient ID: Mary Murillo, female    DOB: 01/16/1951, 71 y.o.   MRN: [id number]  HPI  71 year old Female seen for health maintenance exam and evaluation of medical issues.  She will have Medicare wellness exam tomorrow.  She had left knee arthroplasty in June 2022 by Dr. Alvan Dame.  History of hyperlipidemia.  She took Crestor for short period of time but discontinued it.  She has a history of asthma, allergic rhinitis, history of vitamin D deficiency.  History of Candida vaginal infections related to being on antibiotics.  History of hypothyroidism and history of mild depression.  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  Had history of thrombosed hemorrhoid in 2003/01/23.  Tonsillectomy and adenoidectomy 01-23-55, C-section 01-22-81, oral mass removed from under her tongue in 2002/01/22 that was benign.  Hysterectomy without oophorectomy 1991.  Colonoscopy done by Dr. Delfin Edis in 01-22-2010 with 10-year follow-up recommended.  Repeat study done by Dr. Maurene Capes in 01/23/20 showed adenomatous polyps being removed and 3-year follow-up recommended by Dr. Tarri Glenn.  History of right knee osteoarthritis.  Social history: Married with 2 adult daughters.  Does not smoke.  Social alcohol consumption.  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  Husband was a Pharmacist, hospital at [employer] day school but has retired.  He has a history of colon cancer.  Family history: 1 sister died of metastatic breast cancer.  Parents died in 22-Jan-2018.  Both parents had dementia.  Father had strokes.  Daughter with history of Lyme disease.  Patient takes Celebrex for musculoskeletal pain.  Has pure hypercholesterolemia but has not wanted to be on statin medication.    Review of Systems see above-no new complaints.  Denies chest pain, shortness of breath, bowel issues, bladder issues.     Objective:   Physical Exam Skin: Warm and dry.   Nodes none.  TMs clear.  Neck is supple without JVD thyromegaly or carotid bruits.  Chest clear.  Cardiac exam: Regular rate and rhythm.  Breasts are without masses.  Abdomen is soft, nondistended without hepatosplenomegaly masses or tenderness.  No lower extremity pitting edema.  Brief neurological exam intact without gross focal deficits.       Assessment & Plan:  History of left knee TKA in June 2022  History of asthma  History of allergic rhinitis  History of hyperlipidemia-took Crestor but subsequently discontinued and will restart 5 mg daily  History of hypothyroidism treated with levothyroxine 50 mcg daily  History of mild depression treated with Lexapro  History of vitamin D deficiency  History of thrombosed hemorrhoid January 23, 2003  History of tonsillectomy and adenoidectomy 1956  C-section 1981-01-22  History of left fallopian tube removal 1993  Right knee osteoarthritis treated with Celebrex  Plan: Immunizations discussed.  Flu vaccine given on October 17  Received Shingrix vaccine September 2023 in July 2023 May want to get pneumococcal 20 vaccine but has had pneumococcal 13 and 23.  Flu vaccine given.  Total cholesterol is 229, HDL is low at 48, triglycerides normal at 143 and LDL is elevated at 154.  Plan: I think she could benefit from low-dose Crestor.  Recommend follow-up with lipid panel liver functions and office visit in 6 months.  Refill Celebrex.

## 2022-08-20 ENCOUNTER — Ambulatory Visit (INDEPENDENT_AMBULATORY_CARE_PROVIDER_SITE_OTHER): Payer: Medicare PPO

## 2022-08-20 DIAGNOSIS — Z0000 Encounter for general adult medical examination without abnormal findings: Secondary | ICD-10-CM

## 2022-08-20 NOTE — Progress Notes (Addendum)
Subjective:   Mary Murillo is a 71 y.o. female who presents for Medicare Annual (Subsequent) preventive examinatio        Objective:    There were no vitals filed for this visit. There is no height or weight on file to calculate BMI.     08/19/2021   11:19 AM 04/12/2021    8:10 AM 01/15/2021    4:27 PM 01/09/2021    8:30 AM  Advanced Directives  Does Patient Have a Medical Advance Directive? Yes Yes Yes Yes  Type of Paramedic of Mary Murillo;Living will Living will;Healthcare Power of Mary Murillo;Living will [place];Living will  Does patient want to make changes to medical advance directive? No - Patient declined  No - Patient declined No - Patient declined  Copy of [place] in Chart? No - copy requested  No - copy requested No - copy requested    Current Medications (verified) Outpatient Encounter Medications as of 08/20/2022  Medication Sig   CALCIUM PO Take 2 tablets by mouth 2 (two) times daily with a meal.   Camphor-Menthol-Capsicum (TIGER BALM PAIN RELIEVING) 80-24-16 MG PTCH Apply 1 patch topically daily as needed (pain).   celecoxib (CELEBREX) 200 MG capsule Take 1 capsule (200 mg total) by mouth 2 (two) times daily.   Cholecalciferol (VITAMIN D3) LIQD Place 4,000 Units under the tongue daily.   CRANBERRY PO Take 1 tablet by mouth at bedtime.   Digestive Enzyme CAPS Take 1 capsule by mouth daily with supper.   escitalopram (LEXAPRO) 20 MG tablet TAKE 1 TABLET BY MOUTH EVERY DAY   Ginkgo Biloba (GINKGO PO) Take 1 capsule by mouth daily.   hydrocortisone (ANUSOL-HC) 25 MG suppository Place 1 suppository (25 mg total) rectally at bedtime.   levothyroxine (SYNTHROID) 50 MCG tablet TAKE 1 TABLET BY MOUTH EVERY DAY BEFORE BREAKFAST   nystatin (MYCOSTATIN) 500000 units TABS tablet Take 500,000 Units by mouth 2 (two) times daily as needed (yeast infections).   OVER THE COUNTER MEDICATION Take  1 tablet by mouth 2 (two) times daily with a meal. Omegazyme Ultra   OVER THE COUNTER MEDICATION Take 1-2 tablets by mouth See admin instructions. Cholesterol Regulator 2 - Take 1 tablet at breakfast and 2 tablets at dinner   Probiotic Product (PROBIOTIC PO) Take 2 capsules by mouth at bedtime.   rosuvastatin (CRESTOR) 5 MG tablet Take 1 tablet (5 mg total) by mouth daily.   No facility-administered encounter medications on file as of 08/20/2022.    Allergies (verified) Lactose intolerance (gi), Terazol [terconazole], Wheat bran, Nickel, and Other   History: Past Medical History:  Diagnosis Date   Allergy    hx of seasonal allergies   Anxiety    on meds   Arthritis    osteoarthritis in bilateral knees   Asthma    hx of-no meds after dropping wheat and dairy   Depression    on meds   Hyperlipidemia    Hypothyroidism    on meds   PONV (postoperative nausea and vomiting)    vomiting x1 s/p hysterectomy in 1990s   Vitamin D deficiency    on meds   Past Surgical History:  Procedure Laterality Date   BREAST CYST ASPIRATION Left    CESAREAN SECTION  1982   COLONOSCOPY  2011   DB-hems/TICS   TONSILLECTOMY/ADENOIDECTOMY/TURBINATE REDUCTION     TOTAL KNEE ARTHROPLASTY Right 01/15/2021   Procedure: TOTAL KNEE ARTHROPLASTY;  Surgeon: Alvan Dame,  Rodman Key, MD;  Location: [facility];  Service: Orthopedics;  Laterality: Right;  70 mins *Has a Nickel Allergy!   TOTAL KNEE ARTHROPLASTY Left 04/16/2021   Procedure: TOTAL KNEE ARTHROPLASTY;  Surgeon: Paralee Cancel, MD;  Location: [facility];  Service: Orthopedics;  Laterality: Left;  70 mins *Has a Nickel Allergy!   VAGINAL HYSTERECTOMY  1991   WISDOM TOOTH EXTRACTION     Family History  Problem Relation Age of Onset   Breast cancer Sister 33   Liver cancer Sister 15       mets from breast   Testicular cancer Father 67   Colon polyps Neg Hx    Colon cancer Neg Hx    Esophageal cancer Neg Hx    Stomach cancer Neg Hx    Social History    Socioeconomic History   Marital status: Married    Spouse name: Not on file   Number of children: Not on file   Years of education: Not on file   Highest education level: Not on file  Occupational History   Not on file  Tobacco Use   Smoking status: Former   Smokeless tobacco: Never   Tobacco comments:    quit in 20's   Vaping Use   Vaping Use: Never used  Substance and Sexual Activity   Alcohol use: Not Currently    Alcohol/week: 4.0 standard drinks of alcohol    Types: 2 Glasses of wine, 2 Standard drinks or equivalent per week    Comment: occasional at dinner   Drug use: No   Sexual activity: Not Currently  Other Topics Concern   Not on file  Social History Narrative   Not on file   Social Determinants of Health   Financial Resource Strain: Not on file  Food Insecurity: Not on file  Transportation Needs: Not on file  Physical Activity: Not on file  Stress: Not on file  Social Connections: Not on file    Tobacco Counseling Counseling given: Not Answered Tobacco comments: quit in 20's    Clinical Intake:                 Diabetic?NO         Activities of Daily Living     No data to display           Patient Care Team: Baxley, Cresenciano Lick, MD as PCP - General (Internal Medicine)  Indicate any recent Medical Services you may have received from other than [facility] providers in the past year (date may be approximate).     Assessment:   This is a routine wellness examination for Mary Murillo.  Hearing/Vision screen No results found.  Dietary issues and exercise activities discussed:     Goals Addressed   None   Depression Screen    08/19/2022    2:32 PM 08/19/2021   11:20 AM 08/17/2020   10:12 AM 08/17/2019   10:11 AM 07/13/2018    2:10 PM 06/29/2017   10:25 AM 06/19/2015   11:14 AM  PHQ 2/9 Scores  PHQ - 2 Score 0 0 0 0 0 0 0    Fall Risk    08/19/2022    2:32 PM 08/19/2021   11:20 AM 08/17/2020   10:12 AM 02/23/2020   10:08 AM  08/17/2019   10:11 AM  Fall Risk   Falls in the past year? 0 0 1 0 0  Number falls in past yr: 0 0 0 0   Injury with Fall? 0 0 0  0   Risk for fall due to : No Fall Risks No Fall Risks  No Fall Risks   Follow up Falls evaluation completed Falls evaluation completed Falls evaluation completed Falls evaluation completed     Mary Murillo:  Any stairs in or around the home? Yes  If so, are there any without handrails? Yes  Home free of loose throw rugs in walkways, pet beds, electrical cords, etc? Yes  Adequate lighting in your home to reduce risk of falls? Yes   ASSISTIVE DEVICES UTILIZED TO PREVENT FALLS:  Life alert? No  Use of a cane, walker or w/c? No  Grab bars in the bathroom? Yes  Shower chair or bench in shower? Yes  Elevated toilet seat or a handicapped toilet? No   TIMED UP AND GO:  Was the test performed? No .  Length of time to ambulate 10 feet: N/A sec.   Gait slow and steady with assistive device  Cognitive Function:        08/19/2021   11:21 AM  6CIT Screen  What Year? 0 points  What month? 0 points  What time? 0 points  Count back from 20 0 points  Months in reverse 0 points  Repeat phrase 0 points  Total Score 0 points    Immunizations Immunization History  Administered Date(s) Administered   COVID-19, mRNA, vaccine(Comirnaty)12 years and older 08/11/2022   Influenza Whole 10/15/2009, 09/08/2011   Influenza,inj,Quad PF,6+ Mos 06/29/2017, 07/13/2018, 08/19/2022   Influenza-Unspecified 08/17/2019   PFIZER(Purple Top)SARS-COV-2 Vaccination 12/11/2019, 01/04/2020, 07/25/2020   Pfizer Covid-19 Vaccine Bivalent Booster 76yr & up 07/17/2021, 03/06/2022   Pneumococcal Conjugate-13 08/21/2015   Pneumococcal Polysaccharide-23 11/03/2000, 07/13/2018   Tdap 11/03/2001, 06/02/2013   Zoster Recombinat (Shingrix) 05/14/2022, 07/27/2022   Zoster, Live 03/04/2011    TDAP status: Up to date  Flu Vaccine status: Up to  date  Pneumococcal vaccine status: Up to date  Covid-19 vaccine status: Information provided on how to obtain vaccines.   Qualifies for Shingles Vaccine? Yes   Zostavax completed Yes   Shingrix Completed?: Yes  Screening Tests Health Maintenance  Topic Date Due   COVID-19 Vaccine (6 - Pfizer risk series) 02/18/2023 (Originally 05/01/2022)   TETANUS/TDAP  06/03/2023   COLONOSCOPY (Pts 45-412yrInsurance coverage will need to be confirmed)  08/08/2023   MAMMOGRAM  07/03/2024   Pneumonia Vaccine 65107Years old  Completed   INFLUENZA VACCINE  Completed   DEXA SCAN  Completed   Zoster Vaccines- Shingrix  Completed   HPV VACCINES  Aged Out   Hepatitis C Screening  Discontinued    Health Maintenance  There are no preventive care reminders to display for this patient.  Colorectal cancer screening: Type of screening: Colonoscopy. Completed YES. Repeat every 3 years  Mammogram status: Completed 2. Repeat every year  Bone Density status: Completed YES. Results reflect: Bone density results: OSTEOPENIA. Repeat every 2 years.  Lung Cancer Screening: (Low Dose CT Chest recommended if Age 72-80ears, 30 pack-year currently smoking OR have quit w/in 15years.) does not qualify.   Lung Cancer Screening Referral: N/A   Additional Screening:  Hepatitis C Screening: does not qualify; Completed N/A  Vision Screening: Recommended annual ophthalmology exams for early detection of glaucoma and other disorders of the eye. Is the patient up to date with their annual eye exam?  Yes  Who is the provider or what is the name of the office in which the patient attends annual eye exams? 1  If pt is not established with a provider, would they like to be referred to a provider to establish care? No .   Dental Screening: Recommended annual dental exams for proper oral hygiene  Community Resource Referral / Chronic Care Management: CRR required this visit?  No   CCM required this visit?  No       Plan:     I have personally reviewed and noted the following in the patient's chart:   Medical and social history Use of alcohol, tobacco or illicit drugs  Current medications and supplements including opioid prescriptions. Patient is not currently taking opioid prescriptions. Functional ability and status Nutritional status Physical activity Advanced directives List of other physicians Hospitalizations, surgeries, and ER visits in previous 12 months Vitals Screenings to include cognitive, depression, and falls Referrals and appointments  In addition, I have reviewed and discussed with patient certain preventive protocols, quality metrics, and best practice recommendations. A written personalized care plan for preventive services as well as general preventive health recommendations were provided to patient.     Valari Taylor Barron Alvine, CMA   08/20/2022   I, Elby Showers, MD, have reviewed all documentation for this visit. The documentation on 08/25/22 for the exam, diagnosis, procedures, and orders are all accurate and complete.

## 2022-08-21 ENCOUNTER — Ambulatory Visit: Payer: Medicare PPO | Admitting: Internal Medicine

## 2022-08-26 DIAGNOSIS — M25551 Pain in right hip: Secondary | ICD-10-CM | POA: Diagnosis not present

## 2022-09-01 NOTE — Patient Instructions (Addendum)
It was a pleasure to see you today.  Start low-dose Crestor and follow-up in 6 months.  Vaccines discussed.  Flu vaccine given.  Refill Celebrex.

## 2022-09-17 DIAGNOSIS — M7061 Trochanteric bursitis, right hip: Secondary | ICD-10-CM | POA: Diagnosis not present

## 2022-09-17 DIAGNOSIS — M25552 Pain in left hip: Secondary | ICD-10-CM | POA: Diagnosis not present

## 2022-09-17 DIAGNOSIS — M1611 Unilateral primary osteoarthritis, right hip: Secondary | ICD-10-CM | POA: Diagnosis not present

## 2022-11-10 DIAGNOSIS — D123 Benign neoplasm of transverse colon: Secondary | ICD-10-CM | POA: Diagnosis not present

## 2022-11-10 DIAGNOSIS — D122 Benign neoplasm of ascending colon: Secondary | ICD-10-CM | POA: Diagnosis not present

## 2022-12-30 ENCOUNTER — Other Ambulatory Visit: Payer: Medicare PPO

## 2022-12-30 ENCOUNTER — Telehealth: Payer: Self-pay | Admitting: Internal Medicine

## 2022-12-30 DIAGNOSIS — E785 Hyperlipidemia, unspecified: Secondary | ICD-10-CM | POA: Diagnosis not present

## 2022-12-30 DIAGNOSIS — M791 Myalgia, unspecified site: Secondary | ICD-10-CM

## 2022-12-30 DIAGNOSIS — E559 Vitamin D deficiency, unspecified: Secondary | ICD-10-CM | POA: Diagnosis not present

## 2022-12-30 NOTE — Telephone Encounter (Signed)
Mary Murillo [phone]  [name] called to say she is having muscle pain and weakness, it has been going on for a month or so, she stated it is uncomfortable to exercise, walk or sleep. She is wandering if it is the Crestor she started. She said she was going to go of the medicine to see, but she would like to come in and have labs and Vit D

## 2022-12-31 LAB — LIPID PANEL
Cholesterol: 181 mg/dL (ref <200)
HDL: 76 mg/dL (ref >=50)
LDL Cholesterol (Calc): 87 mg/dL (calc)
Non-HDL Cholesterol (Calc): 105 mg/dL (calc) (ref <130)
Total CHOL/HDL Ratio: 2.4 (calc) (ref <5.0)
Triglycerides: 87 mg/dL (ref <150)

## 2022-12-31 LAB — CK, TOTAL(REFL): Total CK: 95 U/L (ref 29–143)

## 2022-12-31 LAB — VITAMIN D 25 HYDROXY (VIT D DEFICIENCY, FRACTURES): Vit D, 25-Hydroxy: 52 ng/mL (ref 30–100)

## 2022-12-31 NOTE — Progress Notes (Signed)
Patient Care Team: Elby Showers, MD as PCP - General (Internal Medicine)  Visit Date: 01/01/23  Subjective:    Patient ID: Mary Murillo , Female   DOB: 03-Jan-1951, 72 y.o.    MRN: [id number]   71 y.o. Female presents today for muscle pain, weakness. Patient has a past medical history of arthritis, anxiety, asthma, depression, hyperlipidemia, hypothyroidism, Vitamin D deficiency.  History of right and left hip osteoarthritis, trochanteric bursitis. Has been doing dry needling therapy weekly. Has had bilateral trochanter injections for bursitis at [facility].  History of hyperlipidemia treated with Crestor 5 mg daily. No longer taking due to suspected myalgias experienced when taking. CHOL elevated at 229, LDL at 154, HDL low at 48 four months ago. CHOL down to 181, HDL at 76, TRIG at 87, LDL at 87 on 12/30/22. 08/30/21 coronary calcium score at 2.  Vitamin D deficiency treated with Vitamin D3 4,000 units daily. She would prefer to take OTC Vitamin D.    Past Medical History:  Diagnosis Date   Allergy    hx of seasonal allergies   Anxiety    on meds   Arthritis    osteoarthritis in bilateral knees   Asthma    hx of-no meds after dropping wheat and dairy   Depression    on meds   Hyperlipidemia    Hypothyroidism    on meds   PONV (postoperative nausea and vomiting)    vomiting x1 s/p hysterectomy in 1990s   Vitamin D deficiency    on meds     Family History  Problem Relation Age of Onset   Breast cancer Sister 68   Liver cancer Sister 66       mets from breast   Testicular cancer Father 2   Colon polyps Neg Hx    Colon cancer Neg Hx    Esophageal cancer Neg Hx    Stomach cancer Neg Hx     Social History   Social History Narrative   Not on file      Review of Systems  Constitutional:  Negative for fever and malaise/fatigue.  HENT:  Negative for congestion.   Eyes:  Negative for blurred vision.  Respiratory:  Negative for cough and shortness of breath.    Cardiovascular:  Negative for chest pain, palpitations and leg swelling.  Gastrointestinal:  Negative for vomiting.  Musculoskeletal:  Positive for joint pain (Bilateral hips). Negative for back pain.  Skin:  Negative for rash.  Neurological:  Negative for loss of consciousness and headaches.        Objective:   Vitals: BP 128/84   Pulse 75   Temp 98.6 F (37 C) (Tympanic)   Ht '5\' 3"'$  (1.6 m)   Wt 189 lb 6.4 oz (85.9 kg)   SpO2 98%   BMI 33.55 kg/m    Physical Exam Vitals and nursing note reviewed.  Constitutional:      General: She is not in acute distress.    Appearance: Normal appearance. She is not toxic-appearing.  HENT:     Head: Normocephalic and atraumatic.  Pulmonary:     Effort: Pulmonary effort is normal.  Skin:    General: Skin is warm and dry.  Neurological:     Mental Status: She is alert and oriented to person, place, and time. Mental status is at baseline.  Psychiatric:        Mood and Affect: Mood normal.        Behavior: Behavior normal.  Thought Content: Thought content normal.        Judgment: Judgment normal.       Results:   Studies obtained and personally reviewed by me:  08/30/21 coronary calcium score at 2.   Labs:       Component Value Date/Time   NA 142 08/15/2022 1000   K 5.2 08/15/2022 1000   CL 110 08/15/2022 1000   CO2 26 08/15/2022 1000   GLUCOSE 92 08/15/2022 1000   BUN 10 08/15/2022 1000   CREATININE 0.84 08/15/2022 1000   CALCIUM 9.8 08/15/2022 1000   PROT 6.8 08/15/2022 1000   ALBUMIN 4.1 06/25/2017 1119   AST 21 08/15/2022 1000   ALT 20 08/15/2022 1000   ALKPHOS 94 06/25/2017 1119   BILITOT 1.1 08/15/2022 1000   GFRNONAA >60 01/16/2021 0321   GFRNONAA 75 08/14/2020 0903   GFRAA 87 08/14/2020 0903     Lab Results  Component Value Date   WBC 5.1 08/15/2022   HGB 14.3 08/15/2022   HCT 42.0 08/15/2022   MCV 92.5 08/15/2022   PLT 333 08/15/2022    Lab Results  Component Value Date   CHOL 181  12/30/2022   HDL 76 12/30/2022   LDLCALC 87 12/30/2022   TRIG 87 12/30/2022   CHOLHDL 2.4 12/30/2022    Lab Results  Component Value Date   HGBA1C 5.6 01/03/2021     Lab Results  Component Value Date   TSH 2.69 08/15/2022      Assessment & Plan:   Right and left hip osteoarthritis and trochanteric bursitis: Has been doing dry needling therapy weekly. Has had bilateral trochanter injections for bursitis at [facility].  History of hyperlipidemia: Stop Crestor. CHOL elevated at 229, LDL at 154, HDL low at 48 four months ago. CHOL down to 181, HDL at 76, TRIG at 87, LDL at 87 on 12/30/22. 08/30/21 coronary calcium score at 2.  Vitamin D deficiency: Will be taking OTC Vitamin D.    Will be returning in the Fall for annual checkup.    I,Alexander Ruley,acting as a Education administrator for Elby Showers, MD.,have documented all relevant documentation on the behalf of Elby Showers, MD,as directed by  Elby Showers, MD while in the presence of Elby Showers, MD.   I, Elby Showers, MD, have reviewed all documentation for this visit. The documentation on 01/01/23 for the exam, diagnosis, procedures, and orders are all accurate and complete.

## 2023-01-01 ENCOUNTER — Encounter: Payer: Self-pay | Admitting: Internal Medicine

## 2023-01-01 ENCOUNTER — Ambulatory Visit: Payer: Medicare PPO | Admitting: Internal Medicine

## 2023-01-01 VITALS — BP 128/84 | HR 75 | Temp 98.6°F | Ht 63.0 in | Wt 189.4 lb

## 2023-01-01 DIAGNOSIS — M791 Myalgia, unspecified site: Secondary | ICD-10-CM | POA: Diagnosis not present

## 2023-01-01 DIAGNOSIS — T466X5A Adverse effect of antihyperlipidemic and antiarteriosclerotic drugs, initial encounter: Secondary | ICD-10-CM

## 2023-01-01 NOTE — Patient Instructions (Signed)
Discontinue crestor. Take Vitamin D supplement. RTC Fall 2024 for Medicare wellness visit and health maintenance exam.

## 2023-02-11 ENCOUNTER — Other Ambulatory Visit: Payer: Self-pay | Admitting: Internal Medicine

## 2023-02-13 ENCOUNTER — Other Ambulatory Visit: Payer: Medicare PPO

## 2023-02-16 ENCOUNTER — Ambulatory Visit: Payer: Medicare PPO | Admitting: Internal Medicine

## 2023-02-20 DIAGNOSIS — S61310A Laceration without foreign body of right index finger with damage to nail, initial encounter: Secondary | ICD-10-CM | POA: Diagnosis not present

## 2023-02-24 DIAGNOSIS — S61310D Laceration without foreign body of right index finger with damage to nail, subsequent encounter: Secondary | ICD-10-CM | POA: Diagnosis not present

## 2023-03-12 DIAGNOSIS — L814 Other melanin hyperpigmentation: Secondary | ICD-10-CM | POA: Diagnosis not present

## 2023-03-12 DIAGNOSIS — L820 Inflamed seborrheic keratosis: Secondary | ICD-10-CM | POA: Diagnosis not present

## 2023-03-12 DIAGNOSIS — L570 Actinic keratosis: Secondary | ICD-10-CM | POA: Diagnosis not present

## 2023-03-12 DIAGNOSIS — L821 Other seborrheic keratosis: Secondary | ICD-10-CM | POA: Diagnosis not present

## 2023-03-12 DIAGNOSIS — D1801 Hemangioma of skin and subcutaneous tissue: Secondary | ICD-10-CM | POA: Diagnosis not present

## 2023-07-19 ENCOUNTER — Other Ambulatory Visit: Payer: Self-pay | Admitting: Internal Medicine

## 2023-07-19 ENCOUNTER — Encounter: Payer: Self-pay | Admitting: Internal Medicine

## 2023-07-20 ENCOUNTER — Other Ambulatory Visit: Payer: Self-pay | Admitting: Internal Medicine

## 2023-07-20 ENCOUNTER — Encounter: Payer: Self-pay | Admitting: Internal Medicine

## 2023-07-20 ENCOUNTER — Ambulatory Visit: Payer: Medicare PPO | Admitting: Internal Medicine

## 2023-07-20 VITALS — BP 120/80 | HR 78 | Ht 63.0 in | Wt 196.0 lb

## 2023-07-20 DIAGNOSIS — Z23 Encounter for immunization: Secondary | ICD-10-CM | POA: Diagnosis not present

## 2023-07-20 DIAGNOSIS — W57XXXA Bitten or stung by nonvenomous insect and other nonvenomous arthropods, initial encounter: Secondary | ICD-10-CM | POA: Diagnosis not present

## 2023-07-20 DIAGNOSIS — S30860A Insect bite (nonvenomous) of lower back and pelvis, initial encounter: Secondary | ICD-10-CM

## 2023-07-20 DIAGNOSIS — Z1231 Encounter for screening mammogram for malignant neoplasm of breast: Secondary | ICD-10-CM

## 2023-07-20 MED ORDER — DOXYCYCLINE HYCLATE 100 MG PO TABS: ORAL_TABLET | ORAL | 0 refills | Status: DC

## 2023-07-20 MED ORDER — TRIAMCINOLONE ACETONIDE 0.1 % EX CREA: 1.0000 | TOPICAL_CREAM | Freq: Two times a day (BID) | CUTANEOUS | 0 refills | Status: DC

## 2023-07-20 MED ORDER — NYSTATIN 500000 UNITS PO TABS: 500000.0000 [IU] | ORAL_TABLET | ORAL | 0 refills | Status: DC | PRN

## 2023-07-20 NOTE — Progress Notes (Signed)
Patient Care Team: Margaree Mackintosh, MD as PCP - General (Internal Medicine)  Visit Date: 07/20/23  Subjective:    Patient ID: Mary Murillo , Female   DOB: 09/11/1951, 72 y.o.    MRN: [id number]   72 y.o. Female presents today for a tick bite that she noticed on 07/18/23 when she removed a tick from her back. The bite area is painful, itching and is disrupting her sleep. Denies fever, chills, nausea, vomiting.  Last tetanus was on 06/02/13.  Past Medical History:  Diagnosis Date   Allergy    hx of seasonal allergies   Anxiety    on meds   Arthritis    osteoarthritis in bilateral knees   Asthma    hx of-no meds after dropping wheat and dairy   Depression    on meds   Hyperlipidemia    Hypothyroidism    on meds   PONV (postoperative nausea and vomiting)    vomiting x1 s/p hysterectomy in 1990s   Vitamin D deficiency    on meds     Family History  Problem Relation Age of Onset   Breast cancer Sister 47   Liver cancer Sister 33       mets from breast   Testicular cancer Father 22   Colon polyps Neg Hx    Colon cancer Neg Hx    Esophageal cancer Neg Hx    Stomach cancer Neg Hx      Social Hx: Married. 2 adult daughters. Social alcohol consumption. Previously taught art.   Also is a self-employed Veterinary surgeon.     Review of Systems  Constitutional:  Negative for chills, fever and malaise/fatigue.  HENT:  Negative for congestion.   Eyes:  Negative for blurred vision.  Respiratory:  Negative for cough and shortness of breath.   Cardiovascular:  Negative for chest pain, palpitations and leg swelling.  Gastrointestinal:  Negative for nausea and vomiting.  Musculoskeletal:  Negative for back pain.  Skin:  Positive for itching. Negative for rash.       (+) Tick bite back  Neurological:  Negative for loss of consciousness and headaches.        Objective:   Vitals: BP 120/80   Pulse 78   Ht 5\' 3"  (1.6 m)   Wt 196 lb (88.9 kg)   SpO2 96%   BMI 34.72 kg/m     Physical Exam Vitals and nursing note reviewed.  Constitutional:      General: She is not in acute distress.    Appearance: Normal appearance. She is not toxic-appearing.  HENT:     Head: Normocephalic and atraumatic.  Pulmonary:     Effort: Pulmonary effort is normal.  Skin:    General: Skin is warm and dry.     Comments: Oval-shaped, erythematous lesion 2.5 cm in diameter left posterior trunk near scapula. See picture for details.  Neurological:     Mental Status: She is alert and oriented to person, place, and time. Mental status is at baseline.  Psychiatric:        Mood and Affect: Mood normal.        Behavior: Behavior normal.        Thought Content: Thought content normal.        Judgment: Judgment normal.       Results:   Studies obtained and personally reviewed by me:   Labs:       Component Value Date/Time   NA 142 08/15/2022 1000  K 5.2 08/15/2022 1000   CL 110 08/15/2022 1000   CO2 26 08/15/2022 1000   GLUCOSE 92 08/15/2022 1000   BUN 10 08/15/2022 1000   CREATININE 0.84 08/15/2022 1000   CALCIUM 9.8 08/15/2022 1000   PROT 6.8 08/15/2022 1000   ALBUMIN 4.1 06/25/2017 1119   AST 21 08/15/2022 1000   ALT 20 08/15/2022 1000   ALKPHOS 94 06/25/2017 1119   BILITOT 1.1 08/15/2022 1000   GFRNONAA >60 01/16/2021 0321   GFRNONAA 75 08/14/2020 0903   GFRAA 87 08/14/2020 0903     Lab Results  Component Value Date   WBC 5.1 08/15/2022   HGB 14.3 08/15/2022   HCT 42.0 08/15/2022   MCV 92.5 08/15/2022   PLT 333 08/15/2022    Lab Results  Component Value Date   CHOL 181 12/30/2022   HDL 76 12/30/2022   LDLCALC 87 12/30/2022   TRIG 87 12/30/2022   CHOLHDL 2.4 12/30/2022    Lab Results  Component Value Date   HGBA1C 5.6 01/03/2021     Lab Results  Component Value Date   TSH 2.69 08/15/2022      Assessment & Plan:   Tick bite left posterior trunk: prescribed doxycycline 100 mg twice daily for one day, triamcinolone cream. Refilled  nystatin. Administered Tetanus booster. Contact us if symptoms worsen or do not improve.    I,Alexander Ruley,acting as a Neurosurgeon for Margaree Mackintosh, MD.,have documented all relevant documentation on the behalf of Margaree Mackintosh, MD,as directed by  Margaree Mackintosh, MD while in the presence of Margaree Mackintosh, MD.   I, Margaree Mackintosh, MD, have reviewed all documentation for this visit. The documentation on 07/20/23 for the exam, diagnosis, procedures, and orders are all accurate and complete.

## 2023-07-20 NOTE — Patient Instructions (Signed)
Take 2 Doxycycline tabs today for tick bite encounter. Tetanus immunization given. It was a pleasure to see you today.

## 2023-08-07 ENCOUNTER — Other Ambulatory Visit: Payer: Self-pay | Admitting: Internal Medicine

## 2023-08-09 ENCOUNTER — Other Ambulatory Visit: Payer: Self-pay | Admitting: Internal Medicine

## 2023-08-10 ENCOUNTER — Ambulatory Visit (AMBULATORY_SURGERY_CENTER): Payer: Medicare PPO | Admitting: *Deleted

## 2023-08-10 VITALS — Ht 63.0 in | Wt 190.0 lb

## 2023-08-10 DIAGNOSIS — Z860100 Personal history of colon polyps, unspecified: Secondary | ICD-10-CM

## 2023-08-10 MED ORDER — NA SULFATE-K SULFATE-MG SULF 17.5-3.13-1.6 GM/177ML PO SOLN: 1.0000 | Freq: Once | ORAL | 0 refills | Status: AC

## 2023-08-10 NOTE — Progress Notes (Signed)
Pt's name and DOB verified at the beginning of the pre-visit wit 2 identifiers  Pt denies any difficulty with ambulating,sitting, laying down or rolling side to side  Gave both [facility] main # and MD on call # prior to instructions.   No egg or soy allergy known to patient   Pt states she has had oen time issue with PONV with past sedation with any surgeries or procedures  Pt denies having issues being intubated  Pt has no issues moving head neck or swallowing  No FH of Malignant Hyperthermia  Pt is not on diet pills or shots  Pt is not on home 02   Pt is not on blood thinners   Pt denies issues with constipation   Pt is not on dialysis  Pt denise any abnormal heart rhythms   Pt denies any upcoming cardiac testing  Pt encouraged to use to use Singlecare or Goodrx to reduce cost   Patient's chart reviewed by Cathlyn Parsons CNRA prior to pre-visit and patient appropriate for the [facility].  Pre-visit completed and red dot placed by patient's name on their procedure day (on provider's schedule).  .  Visit by phone  Pt states weight is 190 lb  Instructed pt why it is important to and  to call if they have any changes in health or new medications. Directed them to the # given and on instructions.     Instructions reviewed with pt and pt states understanding. Instructed to review again prior to procedure. Pt states they will.   Instructions sent by mail with coupon and by my chart

## 2023-08-20 ENCOUNTER — Ambulatory Visit: Payer: Medicare PPO

## 2023-08-20 DIAGNOSIS — Z1231 Encounter for screening mammogram for malignant neoplasm of breast: Secondary | ICD-10-CM

## 2023-08-20 DIAGNOSIS — H2513 Age-related nuclear cataract, bilateral: Secondary | ICD-10-CM | POA: Diagnosis not present

## 2023-08-20 DIAGNOSIS — H524 Presbyopia: Secondary | ICD-10-CM | POA: Diagnosis not present

## 2023-08-20 DIAGNOSIS — H5203 Hypermetropia, bilateral: Secondary | ICD-10-CM | POA: Diagnosis not present

## 2023-08-20 DIAGNOSIS — H40013 Open angle with borderline findings, low risk, bilateral: Secondary | ICD-10-CM | POA: Diagnosis not present

## 2023-08-20 DIAGNOSIS — H53143 Visual discomfort, bilateral: Secondary | ICD-10-CM | POA: Diagnosis not present

## 2023-08-20 DIAGNOSIS — H52223 Regular astigmatism, bilateral: Secondary | ICD-10-CM | POA: Diagnosis not present

## 2023-08-20 DIAGNOSIS — H40053 Ocular hypertension, bilateral: Secondary | ICD-10-CM | POA: Diagnosis not present

## 2023-08-21 ENCOUNTER — Ambulatory Visit: Payer: Medicare PPO

## 2023-08-31 ENCOUNTER — Encounter: Payer: Self-pay | Admitting: Internal Medicine

## 2023-09-04 ENCOUNTER — Other Ambulatory Visit: Payer: Self-pay | Admitting: Medical Genetics

## 2023-09-04 DIAGNOSIS — Z006 Encounter for examination for normal comparison and control in clinical research program: Secondary | ICD-10-CM

## 2023-09-10 ENCOUNTER — Encounter: Payer: Self-pay | Admitting: Certified Registered Nurse Anesthetist

## 2023-09-11 ENCOUNTER — Encounter: Payer: Self-pay | Admitting: Internal Medicine

## 2023-09-11 ENCOUNTER — Ambulatory Visit: Payer: Medicare PPO | Admitting: Internal Medicine

## 2023-09-11 VITALS — BP 120/67 | HR 57 | Temp 98.1°F | Resp 17 | Ht 63.0 in | Wt 190.0 lb

## 2023-09-11 DIAGNOSIS — K649 Unspecified hemorrhoids: Secondary | ICD-10-CM | POA: Diagnosis not present

## 2023-09-11 DIAGNOSIS — D122 Benign neoplasm of ascending colon: Secondary | ICD-10-CM

## 2023-09-11 DIAGNOSIS — F419 Anxiety disorder, unspecified: Secondary | ICD-10-CM | POA: Diagnosis not present

## 2023-09-11 DIAGNOSIS — Z1211 Encounter for screening for malignant neoplasm of colon: Secondary | ICD-10-CM | POA: Diagnosis not present

## 2023-09-11 DIAGNOSIS — E039 Hypothyroidism, unspecified: Secondary | ICD-10-CM | POA: Diagnosis not present

## 2023-09-11 DIAGNOSIS — D123 Benign neoplasm of transverse colon: Secondary | ICD-10-CM

## 2023-09-11 DIAGNOSIS — Z860100 Personal history of colon polyps, unspecified: Secondary | ICD-10-CM

## 2023-09-11 DIAGNOSIS — Z09 Encounter for follow-up examination after completed treatment for conditions other than malignant neoplasm: Secondary | ICD-10-CM

## 2023-09-11 MED ORDER — SODIUM CHLORIDE 0.9 % IV SOLN
500.0000 mL | INTRAVENOUS | Status: DC
Start: 2023-09-11 — End: 2023-09-11

## 2023-09-11 MED ORDER — HYDROCORTISONE (PERIANAL) 2.5 % EX CREA: 1.0000 | TOPICAL_CREAM | Freq: Two times a day (BID) | CUTANEOUS | 0 refills | Status: AC

## 2023-09-11 NOTE — Patient Instructions (Signed)
Resume previous diet and medications. Awaiting pathology results. Handouts provided on: Colon polyps, Diverticulosis and Hemorrhoids.  YOU HAD AN ENDOSCOPIC PROCEDURE TODAY AT THE [facility]:   Refer to the procedure report that was given to you for any specific questions about what was found during the examination.  If the procedure report does not answer your questions, please call your gastroenterologist to clarify.  If you requested that your care partner not be given the details of your procedure findings, then the procedure report has been included in a sealed envelope for you to review at your convenience later.  YOU SHOULD EXPECT: Some feelings of bloating in the abdomen. Passage of more gas than usual.  Walking can help get rid of the air that was put into your GI tract during the procedure and reduce the bloating. If you had a lower endoscopy (such as a colonoscopy or flexible sigmoidoscopy) you may notice spotting of blood in your stool or on the toilet paper. If you underwent a bowel prep for your procedure, you may not have a normal bowel movement for a few days.  Please Note:  You might notice some irritation and congestion in your nose or some drainage.  This is from the oxygen used during your procedure.  There is no need for concern and it should clear up in a day or so.  SYMPTOMS TO REPORT IMMEDIATELY:  Following lower endoscopy (colonoscopy or flexible sigmoidoscopy):  Excessive amounts of blood in the stool  Significant tenderness or worsening of abdominal pains  Swelling of the abdomen that is new, acute  Fever of 100F or higher  For urgent or emergent issues, a gastroenterologist can be reached at any hour by calling [phone]. Do not use MyChart messaging for urgent concerns.    DIET:  We do recommend a small meal at first, but then you may proceed to your regular diet.  Drink plenty of fluids but you should avoid alcoholic beverages for 24  hours.  ACTIVITY:  You should plan to take it easy for the rest of today and you should NOT DRIVE or use heavy machinery until tomorrow (because of the sedation medicines used during the test).    FOLLOW UP: Our staff will call the number listed on your records the next business day following your procedure.  We will call around 7:15- 8:00 am to check on you and address any questions or concerns that you may have regarding the information given to you following your procedure. If we do not reach you, we will leave a message.     If any biopsies were taken you will be contacted by phone or by letter within the next 1-3 weeks.  Please call us at [phone] if you have not heard about the biopsies in 3 weeks.    SIGNATURES/CONFIDENTIALITY: You and/or your care partner have signed paperwork which will be entered into your electronic medical record.  These signatures attest to the fact that that the information above on your After Visit Summary has been reviewed and is understood.  Full responsibility of the confidentiality of this discharge information lies with you and/or your care-partner.

## 2023-09-11 NOTE — Progress Notes (Signed)
GASTROENTEROLOGY PROCEDURE H&P NOTE   Primary Care Physician: Margaree Mackintosh, MD    Reason for Procedure:   History of colon polyps  Plan:    Colonoscopy  Patient is appropriate for endoscopic procedure(s) in the ambulatory ([facility]) setting.  The nature of the procedure, as well as the risks, benefits, and alternatives were carefully and thoroughly reviewed with the patient. Ample time for discussion and questions allowed. The patient understood, was satisfied, and agreed to proceed.     HPI: Mary Murillo is a 72 y.o. female who presents for colonoscopy for history of colon polyps. Denies blood in stools, changes in bowel habits, or unintentional weight loss. Denies family history of colon cancer.  Past Medical History:  Diagnosis Date   Allergy    hx of seasonal allergies   Anxiety    on meds   Arthritis    osteoarthritis in bilateral knees   Asthma    hx of-no meds after dropping wheat and dairy   Depression    on meds   GERD (gastroesophageal reflux disease)    Hyperlipidemia    Hypothyroidism    on meds   PONV (postoperative nausea and vomiting)    vomiting x1 s/p hysterectomy in 1990s   Vitamin D deficiency    on meds    Past Surgical History:  Procedure Laterality Date   BREAST CYST ASPIRATION Left    CESAREAN SECTION  1982   COLONOSCOPY  2011   DB-hems/TICS   TONSILLECTOMY/ADENOIDECTOMY/TURBINATE REDUCTION     TOTAL KNEE ARTHROPLASTY Right 01/15/2021   Procedure: TOTAL KNEE ARTHROPLASTY;  Surgeon: Durene Romans, MD;  Location: [facility];  Service: Orthopedics;  Laterality: Right;  70 mins *Has a Nickel Allergy!   TOTAL KNEE ARTHROPLASTY Left 04/16/2021   Procedure: TOTAL KNEE ARTHROPLASTY;  Surgeon: Durene Romans, MD;  Location: [facility];  Service: Orthopedics;  Laterality: Left;  70 mins *Has a Nickel Allergy!   VAGINAL HYSTERECTOMY  1991   WISDOM TOOTH EXTRACTION      Prior to Admission medications   Medication Sig Start Date End Date Taking? Authorizing  Provider  escitalopram (LEXAPRO) 20 MG tablet TAKE 1 TABLET BY MOUTH EVERY DAY 08/07/23  Yes Baxley, Luanna Cole, MD  levothyroxine (SYNTHROID) 50 MCG tablet TAKE 1 TABLET BY MOUTH EVERY DAY BEFORE BREAKFAST 08/10/23  Yes Baxley, Luanna Cole, MD  CALCIUM PO Take 2 tablets by mouth 2 (two) times daily with a meal.    [provider]  Camphor-Menthol-Capsicum (TIGER BALM PAIN RELIEVING) 80-24-16 MG PTCH Apply 1 patch topically daily as needed (pain).    [provider]  celecoxib (CELEBREX) 200 MG capsule Take 1 capsule (200 mg total) by mouth 2 (two) times daily. Patient not taking: Reported on 08/10/2023 08/19/22   Margaree Mackintosh, MD  Cholecalciferol (VITAMIN D3) LIQD Place 4,000 Units under the tongue daily.    [provider]  CRANBERRY PO Take 1 tablet by mouth at bedtime.    [provider]  Digestive Enzyme CAPS Take 1 capsule by mouth daily with supper.    [provider]  fish oil-omega-3 fatty acids 1000 MG capsule Take 2 g by mouth daily.    [provider]  Ginkgo Biloba (GINKGO PO) Take 1 capsule by mouth daily.    [provider]  hydrocortisone (ANUSOL-HC) 25 MG suppository Place 1 suppository (25 mg total) rectally at bedtime. Patient not taking: Reported on 01/01/2023 01/22/21   Tressia Danas, MD  nystatin (MYCOSTATIN) 500000  units TABS tablet TAKE 1 TABLET BY MOUTH AS NEEDED (YEAST INFECTIONS). Patient not taking: Reported on 08/10/2023 07/20/23   Margaree Mackintosh, MD  OVER THE COUNTER MEDICATION Take 1-2 tablets by mouth See admin instructions. Cholesterol Regulator 2 - Take 1 tablet at breakfast and 2 tablets at dinner    [provider]  Probiotic Product (PROBIOTIC PO) Take 2 capsules by mouth at bedtime.    [provider]    Current Outpatient Medications  Medication Sig Dispense Refill   escitalopram (LEXAPRO) 20 MG tablet TAKE 1 TABLET BY MOUTH EVERY DAY 90 tablet 3   levothyroxine (SYNTHROID) 50 MCG  tablet TAKE 1 TABLET BY MOUTH EVERY DAY BEFORE BREAKFAST 90 tablet 0   CALCIUM PO Take 2 tablets by mouth 2 (two) times daily with a meal.     Camphor-Menthol-Capsicum (TIGER BALM PAIN RELIEVING) 80-24-16 MG PTCH Apply 1 patch topically daily as needed (pain).     celecoxib (CELEBREX) 200 MG capsule Take 1 capsule (200 mg total) by mouth 2 (two) times daily. (Patient not taking: Reported on 08/10/2023) 60 capsule 3   Cholecalciferol (VITAMIN D3) LIQD Place 4,000 Units under the tongue daily.     CRANBERRY PO Take 1 tablet by mouth at bedtime.     Digestive Enzyme CAPS Take 1 capsule by mouth daily with supper.     fish oil-omega-3 fatty acids 1000 MG capsule Take 2 g by mouth daily.     Ginkgo Biloba (GINKGO PO) Take 1 capsule by mouth daily.     hydrocortisone (ANUSOL-HC) 25 MG suppository Place 1 suppository (25 mg total) rectally at bedtime. (Patient not taking: Reported on 01/01/2023) 12 suppository 3   nystatin (MYCOSTATIN) 500000 units TABS tablet TAKE 1 TABLET BY MOUTH AS NEEDED (YEAST INFECTIONS). (Patient not taking: Reported on 08/10/2023) 42 tablet 0   OVER THE COUNTER MEDICATION Take 1-2 tablets by mouth See admin instructions. Cholesterol Regulator 2 - Take 1 tablet at breakfast and 2 tablets at dinner     Probiotic Product (PROBIOTIC PO) Take 2 capsules by mouth at bedtime.     Current Facility-Administered Medications  Medication Dose Route Frequency Provider Last Rate Last Admin   0.9 %  sodium chloride infusion  500 mL Intravenous Continuous Imogene Burn, MD        Allergies as of 09/11/2023 - Review Complete 09/11/2023  Allergen Reaction Noted   Lactose intolerance (gi) Shortness Of Breath 12/27/2020   Terazol [terconazole]  04/30/2011   Wheat Shortness Of Breath 12/27/2020   Nickel  01/02/2021   Other Itching and Rash 08/11/2012    Family History  Problem Relation Age of Onset   Testicular cancer Father 66   Breast cancer Sister 38   Liver cancer Sister 39        mets from breast   Colon polyps Neg Hx    Colon cancer Neg Hx    Esophageal cancer Neg Hx    Stomach cancer Neg Hx    Rectal cancer Neg Hx     Social History   Socioeconomic History   Marital status: Married    Spouse name: Not on file   Number of children: Not on file   Years of education: Not on file   Highest education level: Not on file  Occupational History   Not on file  Tobacco Use   Smoking status: Former   Smokeless tobacco: Never   Tobacco comments:    quit in 20's   Vaping Use  Vaping status: Never Used  Substance and Sexual Activity   Alcohol use: Yes    Alcohol/week: 4.0 standard drinks of alcohol    Types: 2 Glasses of wine, 2 Standard drinks or equivalent per week    Comment: occasional at dinner   Drug use: No   Sexual activity: Not Currently  Other Topics Concern   Not on file  Social History Narrative   Not on file   Social Determinants of Health   Financial Resource Strain: Not on file  Food Insecurity: Not on file  Transportation Needs: Not on file  Physical Activity: Not on file  Stress: Not on file  Social Connections: Not on file  Intimate Partner Violence: Not on file    Physical Exam: Vital signs in last 24 hours: BP 121/73   Pulse 73   Temp 98.1 F (36.7 C)   Ht 5\' 3"  (1.6 m)   Wt 190 lb (86.2 kg)   SpO2 94%   BMI 33.66 kg/m  GEN: NAD EYE: Sclerae anicteric ENT: MMM CV: Non-tachycardic Pulm: No increased work of breathing GI: Soft, NT/ND NEURO:  Alert & Oriented   Eulah Pont, MD [facility]  09/11/2023 8:34 AM

## 2023-09-11 NOTE — Progress Notes (Signed)
Called to room to assist during endoscopic procedure.  Patient ID and intended procedure confirmed with present staff. Received instructions for my participation in the procedure from the performing physician.  

## 2023-09-11 NOTE — Progress Notes (Signed)
Pt's states no medical or surgical changes since previsit or office visit. 

## 2023-09-11 NOTE — Progress Notes (Signed)
Report given to PACU, vss 

## 2023-09-11 NOTE — Op Note (Addendum)
[facility] Patient Name: Mary Murillo Procedure Date: 09/11/2023 8:42 AM MRN: [id number] Endoscopist: Madelyn Brunner [place] , , [id number] Age: 72 Referring MD:  Date of Birth: 1951-05-28 Gender: Female Account #: [id number] Procedure:                Colonoscopy Indications:              High risk colon cancer surveillance: Personal                            history of colonic polyps Medicines:                Monitored Anesthesia Care Procedure:                Pre-Anesthesia Assessment:                           - Prior to the procedure, a History and Physical                            was performed, and patient medications and                            allergies were reviewed. The patient's tolerance of                            previous anesthesia was also reviewed. The risks                            and benefits of the procedure and the sedation                            options and risks were discussed with the patient.                            All questions were answered, and informed consent                            was obtained. Prior Anticoagulants: The patient has                            taken no anticoagulant or antiplatelet agents. ASA                            Grade Assessment: II - A patient with mild systemic                            disease. After reviewing the risks and benefits,                            the patient was deemed in satisfactory condition to                            undergo the procedure.  After obtaining informed consent, the colonoscope                            was passed under direct vision. Throughout the                            procedure, the patient's blood pressure, pulse, and                            oxygen saturations were monitored continuously. The                            Olympus Scope SN: [id number] was introduced through                            the anus and advanced to the the  terminal ileum.                            The colonoscopy was performed without difficulty.                            The patient tolerated the procedure well. The                            quality of the bowel preparation was good. The                            terminal ileum, ileocecal valve, appendiceal                            orifice, and rectum were photographed. Scope In: 8:49:10 AM Scope Out: 9:08:22 AM Scope Withdrawal Time: 0 hours 15 minutes 5 seconds  Total Procedure Duration: 0 hours 19 minutes 12 seconds  Findings:                 The terminal ileum appeared normal.                           Two sessile polyps were found in the transverse                            colon and ascending colon. The polyps were 3 to 4                            mm in size. These polyps were removed with a cold                            snare. Resection and retrieval were complete.                           Multiple diverticula were found in the sigmoid                            colon, descending colon and transverse  colon.                           Non-bleeding internal hemorrhoids were found during                            retroflexion. Complications:            No immediate complications. Estimated Blood Loss:     Estimated blood loss was minimal. Impression:               - The examined portion of the ileum was normal.                           - Two 3 to 4 mm polyps in the transverse colon and                            in the ascending colon, removed with a cold snare.                            Resected and retrieved.                           - Diverticulosis in the sigmoid colon, in the                            descending colon and in the transverse colon.                           - Non-bleeding internal hemorrhoids. Recommendation:           - Discharge patient to home (with escort).                           - Await pathology results.                           - Anusol  HC cream BID for 7 days.                           - The findings and recommendations were discussed                            with the patient. Dr Particia Lather "[name]" [name],  09/11/2023 9:11:31 AM

## 2023-09-14 ENCOUNTER — Telehealth: Payer: Self-pay

## 2023-09-14 ENCOUNTER — Other Ambulatory Visit: Payer: Medicare PPO

## 2023-09-14 DIAGNOSIS — Z0000 Encounter for general adult medical examination without abnormal findings: Secondary | ICD-10-CM | POA: Diagnosis not present

## 2023-09-14 DIAGNOSIS — E7800 Pure hypercholesterolemia, unspecified: Secondary | ICD-10-CM

## 2023-09-14 DIAGNOSIS — E039 Hypothyroidism, unspecified: Secondary | ICD-10-CM

## 2023-09-14 NOTE — Telephone Encounter (Signed)
  Follow up Call-     09/11/2023    7:46 AM  Call back number  Post procedure Call Back phone  # [phone]  Permission to leave phone message Yes     Patient questions:  Do you have a fever, pain , or abdominal swelling? No. Pain Score  0 *  Have you tolerated food without any problems? Yes.    Have you been able to return to your normal activities? Yes.    Do you have any questions about your discharge instructions: Diet   No. Medications  No. Follow up visit  No.  Do you have questions or concerns about your Care? No.  Actions: * If pain score is 4 or above: No action needed, pain <4.

## 2023-09-15 ENCOUNTER — Encounter: Payer: Self-pay | Admitting: Internal Medicine

## 2023-09-15 LAB — COMPLETE METABOLIC PANEL WITH GFR
AG Ratio: 1.4 (calc) (ref 1.0–2.5)
ALT: 16 U/L (ref 6–29)
AST: 17 U/L (ref 10–35)
Albumin: 4.1 g/dL (ref 3.6–5.1)
Alkaline phosphatase (APISO): 97 U/L (ref 37–153)
BUN: 7 mg/dL (ref 7–25)
CO2: 26 mmol/L (ref 20–32)
Calcium: 10 mg/dL (ref 8.6–10.4)
Chloride: 108 mmol/L (ref 98–110)
Creat: 0.83 mg/dL (ref 0.60–1.00)
Globulin: 3 g/dL (ref 1.9–3.7)
Glucose, Bld: 101 mg/dL — ABNORMAL HIGH (ref 65–99)
Potassium: 5.2 mmol/L (ref 3.5–5.3)
Sodium: 144 mmol/L (ref 135–146)
Total Bilirubin: 1.1 mg/dL (ref 0.2–1.2)
Total Protein: 7.1 g/dL (ref 6.1–8.1)
eGFR: 74 mL/min/{1.73_m2} (ref >=60)

## 2023-09-15 LAB — TSH: TSH: 2.76 m[IU]/L (ref 0.40–4.50)

## 2023-09-15 LAB — CBC WITH DIFFERENTIAL/PLATELET
Absolute Lymphocytes: 1803 {cells}/uL (ref 850–3900)
Absolute Monocytes: 599 {cells}/uL (ref 200–950)
Basophils Absolute: 28 {cells}/uL (ref 0–200)
Basophils Relative: 0.5 %
Eosinophils Absolute: 78 {cells}/uL (ref 15–500)
Eosinophils Relative: 1.4 %
HCT: 45.7 % — ABNORMAL HIGH (ref 35.0–45.0)
Hemoglobin: 15.1 g/dL (ref 11.7–15.5)
MCH: 30.9 pg (ref 27.0–33.0)
MCHC: 33 g/dL (ref 32.0–36.0)
MCV: 93.5 fL (ref 80.0–100.0)
MPV: 10.1 fL (ref 7.5–12.5)
Monocytes Relative: 10.7 %
Neutro Abs: 3091 {cells}/uL (ref 1500–7800)
Neutrophils Relative %: 55.2 %
Platelets: 377 10*3/uL (ref 140–400)
RBC: 4.89 10*6/uL (ref 3.80–5.10)
RDW: 12.9 % (ref 11.0–15.0)
Total Lymphocyte: 32.2 %
WBC: 5.6 10*3/uL (ref 3.8–10.8)

## 2023-09-15 LAB — SURGICAL PATHOLOGY

## 2023-09-15 LAB — LIPID PANEL
Cholesterol: 213 mg/dL — ABNORMAL HIGH (ref <200)
HDL: 53 mg/dL (ref >=50)
LDL Cholesterol (Calc): 137 mg/dL — ABNORMAL HIGH
Non-HDL Cholesterol (Calc): 160 mg/dL — ABNORMAL HIGH (ref <130)
Total CHOL/HDL Ratio: 4 (calc) (ref <5.0)
Triglycerides: 112 mg/dL (ref <150)

## 2023-09-15 NOTE — Progress Notes (Signed)
Annual Wellness Visit    Patient Care Team: Margaree Mackintosh, MD as PCP - General (Internal Medicine)  Visit Date: 09/15/23   No chief complaint on file.   Subjective:   Patient: Mary Murillo, Female    DOB: 08-Apr-1951, 72 y.o.   MRN: [id number]  Mary Murillo is a 72 y.o. Female who presents today for her Annual Wellness Visit. History of allergies, anxiety, arthritis, asthma, depression, GERD, hyperlipidemia, hypothyroidism, Vitamin D deficiency.  Patient takes Celebrex for musculoskeletal pain. History of right knee osteoarthritis.    Has pure hypercholesterolemia but has not wanted to be on statin medication.  History of anxiety and depression treated with escitalopram 20 mg daily.  History of hypothyroidism treated with levothyroxine 50 mcg daily.    She had left knee arthroplasty in June 2022 by Dr. Charlann Boxer.  History of hyperlipidemia.  She took Crestor for short period of time but discontinued it.  She has a history of asthma, allergic rhinitis, history of vitamin D deficiency.  History of Candida vaginal infections related to being on antibiotics.  History of hypothyroidism and history of mild depression.   In 12/11/93she had left fallopian tube removed due to benign mesothelial peritoneal cyst with adhesions.  Had history of thrombosed hemorrhoid in 2003-10-14.  Tonsillectomy and adenoidectomy 1956, C-section 13-Oct-1981, oral mass removed from under her tongue in 10/13/2002 that was benign.  Hysterectomy without oophorectomy 1991.  Mammogram normal on 08/20/23.  Colonoscopy last completed 09/11/23. Showed two 3-4 mm polyps in transverse, ascending colon, diverticulosis in sigmoid, descending, transverse colon, non-bleeding internal hemorrhoids. Pathology showed two tubular adenomas. Repeat recommended in 2028/10/13.   Social history: Married with 2 adult daughters.  Does not smoke.  Social alcohol consumption.  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   Husband was a Runner, broadcasting/film/video at [employer] day school but has retired.  He has a history of colon cancer.   Family history: 1 sister died of metastatic breast cancer.  Parents died in 2018-10-13.  Both parents had dementia.  Father had strokes.  Daughter with history of Lyme disease.   Vaccine counseling: UTD on tetanus, shingles, pneumococcal-20 vaccines.  Past Medical History:  Diagnosis Date   Allergy    hx of seasonal allergies   Anxiety    on meds   Arthritis    osteoarthritis in bilateral knees   Asthma    hx of-no meds after dropping wheat and dairy   Depression    on meds   GERD (gastroesophageal reflux disease)    Hyperlipidemia    Hypothyroidism    on meds   PONV (postoperative nausea and vomiting)    vomiting x1 s/p hysterectomy in 1990s   Vitamin D deficiency    on meds     Family History  Problem Relation Age of Onset   Testicular cancer Father 58   Breast cancer Sister 1   Liver cancer Sister 39       mets from breast   Colon polyps Neg Hx    Colon cancer Neg Hx    Esophageal cancer Neg Hx    Stomach cancer Neg Hx    Rectal cancer Neg Hx      Social History   Social History Narrative   Not on file     ROS    Objective:   Vitals: There were no vitals taken for this visit.  Physical Exam   Most  recent functional status assessment:     No data to display         Most recent fall risk assessment:    01/01/2023    2:27 PM  Fall Risk   Falls in the past year? 0  Number falls in past yr: 0  Injury with Fall? 0  Risk for fall due to : No Fall Risks  Follow up Falls prevention discussed    Most recent depression screenings:    01/01/2023    2:27 PM 08/19/2022    2:32 PM  PHQ 2/9 Scores  PHQ - 2 Score 0 0   Most recent cognitive screening:    08/19/2021   11:21 AM  6CIT Screen  What Year? 0 points  What month? 0 points  What time? 0 points  Count back from 20 0 points  Months in reverse 0 points  Repeat phrase 0 points  Total Score  0 points     Results:   Studies obtained and personally reviewed by me:  Imaging, colonoscopy, mammogram, bone density scan, echocardiogram, heart cath, stress test, CT calcium score, etc. ***   Labs:       Component Value Date/Time   NA 144 09/14/2023 0942   K 5.2 09/14/2023 0942   CL 108 09/14/2023 0942   CO2 26 09/14/2023 0942   GLUCOSE 101 (H) 09/14/2023 0942   BUN 7 09/14/2023 0942   CREATININE 0.83 09/14/2023 0942   CALCIUM 10.0 09/14/2023 0942   PROT 7.1 09/14/2023 0942   ALBUMIN 4.1 06/25/2017 1119   AST 17 09/14/2023 0942   ALT 16 09/14/2023 0942   ALKPHOS 94 06/25/2017 1119   BILITOT 1.1 09/14/2023 0942   GFRNONAA >60 01/16/2021 0321   GFRNONAA 75 08/14/2020 0903   GFRAA 87 08/14/2020 0903     Lab Results  Component Value Date   WBC 5.6 09/14/2023   HGB 15.1 09/14/2023   HCT 45.7 (H) 09/14/2023   MCV 93.5 09/14/2023   PLT 377 09/14/2023    Lab Results  Component Value Date   CHOL 213 (H) 09/14/2023   HDL 53 09/14/2023   LDLCALC 137 (H) 09/14/2023   TRIG 112 09/14/2023   CHOLHDL 4.0 09/14/2023    Lab Results  Component Value Date   HGBA1C 5.6 01/03/2021     Lab Results  Component Value Date   TSH 2.76 09/14/2023     No results found for: "PSA1", "PSA" *** delete for female pts  ***  Assessment & Plan:   ***      Annual wellness visit done today including the all of the following: Reviewed patient's Family Medical History Reviewed and updated list of patient's medical providers Assessment of cognitive impairment was done Assessed patient's functional ability Established a written schedule for health screening services Health Risk Assessent Completed and Reviewed  Discussed health benefits of physical activity, and encouraged her to engage in regular exercise appropriate for her age and condition.        I,Alexander Ruley,acting as a Neurosurgeon for Margaree Mackintosh, MD.,have documented all relevant documentation on the behalf of  Margaree Mackintosh, MD,as directed by  Margaree Mackintosh, MD while in the presence of Margaree Mackintosh, MD.   ***

## 2023-09-16 ENCOUNTER — Encounter: Payer: Self-pay | Admitting: Internal Medicine

## 2023-09-16 ENCOUNTER — Ambulatory Visit: Payer: Medicare PPO | Admitting: Internal Medicine

## 2023-09-16 VITALS — BP 130/80 | HR 70 | Ht 62.5 in | Wt 192.0 lb

## 2023-09-16 DIAGNOSIS — Z96652 Presence of left artificial knee joint: Secondary | ICD-10-CM | POA: Diagnosis not present

## 2023-09-16 DIAGNOSIS — Z8659 Personal history of other mental and behavioral disorders: Secondary | ICD-10-CM

## 2023-09-16 DIAGNOSIS — Z23 Encounter for immunization: Secondary | ICD-10-CM

## 2023-09-16 DIAGNOSIS — E7800 Pure hypercholesterolemia, unspecified: Secondary | ICD-10-CM | POA: Diagnosis not present

## 2023-09-16 DIAGNOSIS — Z0000 Encounter for general adult medical examination without abnormal findings: Secondary | ICD-10-CM

## 2023-09-16 DIAGNOSIS — M1711 Unilateral primary osteoarthritis, right knee: Secondary | ICD-10-CM | POA: Diagnosis not present

## 2023-09-16 DIAGNOSIS — E039 Hypothyroidism, unspecified: Secondary | ICD-10-CM

## 2023-09-16 LAB — POCT URINALYSIS DIP (CLINITEK)
Bilirubin, UA: NEGATIVE
Blood, UA: NEGATIVE
Glucose, UA: NEGATIVE mg/dL
Ketones, POC UA: NEGATIVE mg/dL
Leukocytes, UA: NEGATIVE
Nitrite, UA: NEGATIVE
POC PROTEIN,UA: NEGATIVE
Spec Grav, UA: 1.01 (ref 1.010–1.025)
Urobilinogen, UA: 0.2 U/dL
pH, UA: 6.5 (ref 5.0–8.0)

## 2023-09-16 MED ORDER — ATORVASTATIN CALCIUM 10 MG PO TABS: 10.0000 mg | ORAL_TABLET | Freq: Every day | ORAL | 0 refills | Status: DC

## 2023-09-16 NOTE — Patient Instructions (Addendum)
It was a pleasure to see you today.  Please restart statin medication and follow-up after the first of the year with lipid panel.  Vaccines discussed.  Flu vaccine given today.  Other labs are stable.  Continue same dose of thyroid replacement medication and continue Lexapro at same dose.  We will plan to see you again in person for health maintenance exam and Medicare wellness visit in 1 year or as needed.  Statin medication refilled.

## 2023-09-16 NOTE — Progress Notes (Signed)
Subjective:   Mary Murillo is a 72 y.o. female who presents for Medicare Annual (Subsequent) preventive examination.She also presents for health maintenance exam and evaluation of medical issues.  History of pure hypercholesterolemia.  Is taken Crestor generic 10 mg daily.  Subsequently discontinued it and total cholesterol has increased from 181 8 months ago to 213 and LDL cholesterol has increased from 87-1 37.  Triglycerides are normal.  HDL cholesterol is currently 53.  She will restart generic rosuvastatin (Crestor) 10 mg daily.  Fasting glucose is 101, kidney and liver functions are normal.  Thyroid-stimulating hormone is normal as well.  Complete blood count is within normal limits.  She has a history of vitamin D deficiency.  History of Candida vaginal infections related to being on antibiotics.  History of hypothyroidism and mild depression.  In 1993-12-02she had left fallopian tube removed due to benign mesothelial peritoneal cyst with adhesions.  Had thrombosed hemorrhoid October 05, 2003.  History of tonsillectomy and adenoidectomy 1956.  C-section 04-Oct-1981.  Oral mass removed from under her tongue in 10/04/02.  Hysterectomy without oophorectomy 1991.  Colonoscopy done by Dr. Juanda Chance in 2010-10-04 with 10-year follow-up recommended.  Repeat study done October 2021 with adenomatous polyps being removed and 3-year follow-up recommended by Dr. Orvan Falconer.  This is due now.  History of bilateral knee osteoarthritis.  History of musculoskeletal pain treated with Celebrex  Social history: Married with 2 adult daughters.  Does not smoke.  Social alcohol consumption.  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  Husband was a Runner, broadcasting/film/video at [employer] day school but has retired.  Family history: 1 sister died of metastatic breast cancer.  Parents died in 10-04-2018.  Both parents had dementia.  Father had history of strokes.  Daughter with history of Lyme disease.    Visit Complete: In  person  Patient Medicare AWV questionnaire was completed by the patient on 09/16/2023; I have confirmed that all information answered by patient is correct and no changes since this date.  Cardiac Risk Factors include: advanced age (>74men, >46 women);dyslipidemia     Objective:   Blood pressure excellent 130/80, pulse 70 regular weight 192 pounds height 5 feet 2.5 inches, BMI 34.56  Skin: Warm and dry.  TMs are clear.  No cervical adenopathy.  Pharynx is clear.  Neck is supple.  No carotid bruits.  No thyromegaly.  Chest is clear to auscultation without rales or wheezing.  Breasts are without masses.  Cardiac exam: Regular rate and rhythm without murmur or ectopy.  Abdomen soft nondistended without hepatosplenomegaly masses or tenderness.  No lower extremity pitting edema.  Neuro is intact without gross focal deficits.  Pap deferred due to age.  Affect thought and judgment are normal.      Today's Vitals   09/16/23 0953  Weight: 192 lb (87.1 kg)  Height: 5' 2.5" (1.588 m)  PainSc: 0-No pain   Body mass index is 34.56 kg/m.     09/16/2023   10:01 AM 08/19/2021   11:19 AM 04/12/2021    8:10 AM 01/15/2021    4:27 PM 01/09/2021    8:30 AM  Advanced Directives  Does Patient Have a Medical Advance Directive? Yes Yes Yes Yes Yes  Type of Advance Directive Living will;Healthcare Power of [employer] Power of [place];Living will Living will;Healthcare Power of [employer] Power of [place];Living will Healthcare Power of [place];Living will  Does patient want to make changes to medical advance  directive?  No - Patient declined  No - Patient declined No - Patient declined  Copy of Healthcare Power of Attorney in Chart? No - copy requested No - copy requested  No - copy requested No - copy requested    Current Medications (verified) Outpatient Encounter Medications as of 09/16/2023  Medication Sig   CALCIUM PO Take 2 tablets by mouth 2 (two) times daily with a meal.    Camphor-Menthol-Capsicum (TIGER BALM PAIN RELIEVING) 80-24-16 MG PTCH Apply 1 patch topically daily as needed (pain).   celecoxib (CELEBREX) 200 MG capsule Take 1 capsule (200 mg total) by mouth 2 (two) times daily. (Patient not taking: Reported on 08/10/2023)   Cholecalciferol (VITAMIN D3) LIQD Place 4,000 Units under the tongue daily.   CRANBERRY PO Take 1 tablet by mouth at bedtime.   Digestive Enzyme CAPS Take 1 capsule by mouth daily with supper.   escitalopram (LEXAPRO) 20 MG tablet TAKE 1 TABLET BY MOUTH EVERY DAY   fish oil-omega-3 fatty acids 1000 MG capsule Take 2 g by mouth daily.   Ginkgo Biloba (GINKGO PO) Take 1 capsule by mouth daily.   hydrocortisone (ANUSOL-HC) 2.5 % rectal cream Place 1 Application rectally 2 (two) times daily for 7 days.   hydrocortisone (ANUSOL-HC) 25 MG suppository Place 1 suppository (25 mg total) rectally at bedtime. (Patient not taking: Reported on 01/01/2023)   levothyroxine (SYNTHROID) 50 MCG tablet TAKE 1 TABLET BY MOUTH EVERY DAY BEFORE BREAKFAST   nystatin (MYCOSTATIN) 500000 units TABS tablet TAKE 1 TABLET BY MOUTH AS NEEDED (YEAST INFECTIONS). (Patient not taking: Reported on 08/10/2023)   OVER THE COUNTER MEDICATION Take 1-2 tablets by mouth See admin instructions. Cholesterol Regulator 2 - Take 1 tablet at breakfast and 2 tablets at dinner   Probiotic Product (PROBIOTIC PO) Take 2 capsules by mouth at bedtime.   No facility-administered encounter medications on file as of 09/16/2023.    Allergies (verified) Lactose intolerance (gi), Terazol [terconazole], Wheat, Nickel, and Other   History: Past Medical History:  Diagnosis Date   Allergy    hx of seasonal allergies   Anxiety    on meds   Arthritis    osteoarthritis in bilateral knees   Asthma    hx of-no meds after dropping wheat and dairy   Depression    on meds   GERD (gastroesophageal reflux disease)    Hyperlipidemia    Hypothyroidism    on meds   PONV (postoperative nausea and  vomiting)    vomiting x1 s/p hysterectomy in 1990s   Vitamin D deficiency    on meds   Past Surgical History:  Procedure Laterality Date   BREAST CYST ASPIRATION Left    CESAREAN SECTION  1982   COLONOSCOPY  2011   DB-hems/TICS   TONSILLECTOMY/ADENOIDECTOMY/TURBINATE REDUCTION     TOTAL KNEE ARTHROPLASTY Right 01/15/2021   Procedure: TOTAL KNEE ARTHROPLASTY;  Surgeon: Durene Romans, MD;  Location: [facility];  Service: Orthopedics;  Laterality: Right;  70 mins *Has a Nickel Allergy!   TOTAL KNEE ARTHROPLASTY Left 04/16/2021   Procedure: TOTAL KNEE ARTHROPLASTY;  Surgeon: Durene Romans, MD;  Location: [facility];  Service: Orthopedics;  Laterality: Left;  70 mins *Has a Nickel Allergy!   VAGINAL HYSTERECTOMY  1991   WISDOM TOOTH EXTRACTION     Family History  Problem Relation Age of Onset   Testicular cancer Father 46   Breast cancer Sister 23   Liver cancer Sister 39       mets from  breast   Colon polyps Neg Hx    Colon cancer Neg Hx    Esophageal cancer Neg Hx    Stomach cancer Neg Hx    Rectal cancer Neg Hx    Social History   Socioeconomic History   Marital status: Married    Spouse name: Not on file   Number of children: Not on file   Years of education: Not on file   Highest education level: Bachelor's degree (e.g., BA, AB, BS)  Occupational History   Not on file  Tobacco Use   Smoking status: Former   Smokeless tobacco: Never   Tobacco comments:    quit in 20's   Vaping Use   Vaping status: Never Used  Substance and Sexual Activity   Alcohol use: Yes    Alcohol/week: 4.0 standard drinks of alcohol    Types: 2 Glasses of wine, 2 Standard drinks or equivalent per week    Comment: occasional at dinner   Drug use: No   Sexual activity: Not Currently  Other Topics Concern   Not on file  Social History Narrative   Not on file   Social Determinants of Health   Financial Resource Strain: Low Risk  (09/14/2023)   Overall Financial Resource Strain (CARDIA)     Difficulty of Paying Living Expenses: Not hard at all  Food Insecurity: No Food Insecurity (09/14/2023)   Hunger Vital Sign    Worried About Running Out of Food in the Last Year: Never true    Ran Out of Food in the Last Year: Never true  Transportation Needs: No Transportation Needs (09/14/2023)   PRAPARE - Administrator, Civil Service (Medical): No    Lack of Transportation (Non-Medical): No  Physical Activity: Sufficiently Active (09/14/2023)   Exercise Vital Sign    Days of Exercise per Week: 5 days    Minutes of Exercise per Session: 60 min  Stress: No Stress Concern Present (09/14/2023)   [employer] of Occupational Health - Occupational Stress Questionnaire    Feeling of Stress : Not at all  Social Connections: Socially Integrated (09/14/2023)   Social Connection and Isolation Panel [NHANES]    Frequency of Communication with Friends and Family: More than three times a week    Frequency of Social Gatherings with Friends and Family: More than three times a week    Attends Religious Services: More than 4 times per year    Active Member of [employer] or Organizations: Yes    Attends Engineer, structural: More than 4 times per year    Marital Status: Married    Tobacco Counseling Counseling given: Not Answered Tobacco comments: quit in 20's    Clinical Intake:  Pre-visit preparation completed: Yes  Pain : No/denies pain Pain Score: 0-No pain     BMI - recorded: 34.56 Nutritional Status: BMI > 30  Obese Nutritional Risks: None Diabetes: No  How often do you need to have someone help you when you read instructions, pamphlets, or other written materials from your doctor or pharmacy?: 1 - Never     Information entered by :: Dixie Dials, CMA   Activities of Daily Living    09/16/2023    9:59 AM  In your present state of health, do you have any difficulty performing the following activities:  Hearing? 0  Vision? 0  Difficulty  concentrating or making decisions? 0  Walking or climbing stairs? 0  Dressing or bathing? 0  Doing errands, shopping? 0  Preparing Food and eating ? N  Using the Toilet? N  In the past six months, have you accidently leaked urine? N  Do you have problems with loss of bowel control? N  Managing your Medications? N  Managing your Finances? N  Housekeeping or managing your Housekeeping? N    Patient Care Team: Margaree Mackintosh, MD as PCP - General (Internal Medicine)  Indicate any recent Medical Services you may have received from other than [facility] providers in the past year (date may be approximate).     Assessment:   This is a routine wellness examination for Mary Murillo.  Hearing/Vision screen No results found.   Goals Addressed   None    Depression Screen    01/01/2023    2:27 PM 08/19/2022    2:32 PM 08/19/2021   11:20 AM 08/17/2020   10:12 AM 08/17/2019   10:11 AM 07/13/2018    2:10 PM 06/29/2017   10:25 AM  PHQ 2/9 Scores  PHQ - 2 Score 0 0 0 0 0 0 0    Fall Risk    09/16/2023   10:01 AM 01/01/2023    2:27 PM 08/19/2022    2:32 PM 08/19/2021   11:20 AM 08/17/2020   10:12 AM  Fall Risk   Falls in the past year? 1 0 0 0 1  Number falls in past yr: 1 0 0 0 0  Injury with Fall? 0 0 0 0 0  Risk for fall due to : No Fall Risks No Fall Risks No Fall Risks No Fall Risks   Follow up Falls evaluation completed Falls prevention discussed Falls evaluation completed Falls evaluation completed Falls evaluation completed    MEDICARE RISK AT HOME: Medicare Risk at Home Any stairs in or around the home?: Yes If so, are there any without handrails?: No Home free of loose throw rugs in walkways, pet beds, electrical cords, etc?: Yes Adequate lighting in your home to reduce risk of falls?: Yes Life alert?: No Use of a cane, walker or w/c?: Yes Grab bars in the bathroom?: No Shower chair or bench in shower?: No Elevated toilet seat or a handicapped toilet?: No  TIMED UP AND  GO:  Was the test performed?  No    Cognitive Function:        09/16/2023   10:02 AM 08/19/2021   11:21 AM  6CIT Screen  What Year? 0 points 0 points  What month? 0 points 0 points  What time? 0 points 0 points  Count back from 20 0 points 0 points  Months in reverse 0 points 0 points  Repeat phrase 0 points 0 points  Total Score 0 points 0 points    Immunizations Immunization History  Administered Date(s) Administered   Influenza Whole 10/15/2009, 09/08/2011   Influenza,inj,Quad PF,6+ Mos 06/29/2017, 07/13/2018, 08/19/2022   Influenza-Unspecified 08/17/2019   PFIZER(Purple Top)SARS-COV-2 Vaccination 12/11/2019, 01/04/2020, 07/25/2020   PNEUMOCOCCAL CONJUGATE-20 09/04/2022   Pfizer Covid-19 Vaccine Bivalent Booster 88yrs & up 07/17/2021, 03/06/2022   Pfizer(Comirnaty)Fall Seasonal Vaccine 12 years and older 08/11/2022, 07/24/2023   Pneumococcal Conjugate-13 08/21/2015   Pneumococcal Polysaccharide-23 11/03/2000, 07/13/2018   Tdap 11/03/2001, 06/02/2013, 07/20/2019, 07/20/2023   Zoster Recombinant(Shingrix) 05/14/2022, 07/27/2022   Zoster, Live 03/04/2011    TDAP status: Up to date 07/20/23  Flu Vaccine status: Completed at today's visit  Pneumococcal vaccine status: Up to date11/02/23  Covid-19 vaccine status: Completed vaccines  Qualifies for Shingles Vaccine? Yes   Zostavax completed Yes   Shingrix Completed?:  Yes 05/14/22, 07/27/22  Screening Tests Health Maintenance  Topic Date Due   INFLUENZA VACCINE  06/04/2023   Medicare Annual Wellness (AWV)  09/13/2024   MAMMOGRAM  08/19/2025   Colonoscopy  09/10/2026   DTaP/Tdap/Td (5 - Td or Tdap) 07/19/2033   Pneumonia Vaccine 79+ Years old  Completed   DEXA SCAN  Completed   COVID-19 Vaccine  Completed   Zoster Vaccines- Shingrix  Completed   HPV VACCINES  Aged Out   Hepatitis C Screening  Discontinued    Health Maintenance  Health Maintenance Due  Topic Date Due   INFLUENZA VACCINE  06/04/2023     Colorectal cancer screening: Type of screening: Colonoscopy. Completed 09/11/23. Repeat every 3 years  Mammogram status: Completed 08/20/23. Repeat every year   Bone Density status: Completed 08/29/20. Results reflect: Bone density results: OSTEOPENIA. Repeat every 2 years.  Lung Cancer Screening: (Low Dose CT Chest recommended if Age 2-80 years, 20 pack-year currently smoking OR have quit w/in 15years.) does not qualify.    Additional Screening:  Hepatitis C Screening: does not qualify;   Vision Screening: Recommended annual ophthalmology exams for early detection of glaucoma and other disorders of the eye. Is the patient up to date with their annual eye exam?  Yes  Who is the provider or what is the name of the office in which the patient attends annual eye exams? [facility] If pt is not established with a provider, would they like to be referred to a provider to establish care? No .   Dental Screening: Recommended annual dental exams for proper oral hygiene  Community Resource Referral / Chronic Care Management: CRR required this visit?  No   CCM required this visit?  No    Impression:   Pure hypercholesterolemia-patient will take atorvastatin 10 mg daily.  Can follow-up with lipid panel liver functions in February.  Hypothyroidism-TSH is within normal limits on Synthroid 50 mg daily  Adenomatous colon polyps x 2 removed November 2024 by Dr. Norwood Levo  History of depression stable on Lexapro  Health maintenance-vaccines discussed.  Will receive flu vaccine here today.  Plan: Vaccines discussed.  Coronary calcium scoring done October 2022 had an excellent score of 2.  Only circumflex had this score.  Other scores were 0.  Continue current medications including restarting statin medication.  She will follow-up with lipid panel after the first of the year.    Plan:     I have personally reviewed and noted the following in the patient's chart:   Medical and  social history Use of alcohol, tobacco or illicit drugs  Current medications and supplements including opioid prescriptions. Patient is not currently taking opioid prescriptions. Functional ability and status Nutritional status Physical activity Advanced directives List of other physicians Hospitalizations, surgeries, and ER visits in previous 12 months Vitals Screenings to include cognitive, depression, and falls Referrals and appointments  In addition, I have reviewed and discussed with patient certain preventive protocols, quality metrics, and best practice recommendations. A written personalized care plan for preventive services as well as general preventive health recommendations were provided to patient.     Araceli Sharlyne Cai, CMA   09/16/2023   After Visit Summary: (In Person-Printed) AVS printed and given to the patient

## 2023-09-29 ENCOUNTER — Other Ambulatory Visit (HOSPITAL_COMMUNITY)
Admission: RE | Admit: 2023-09-29 | Discharge: 2023-09-29 | Disposition: A | Discharge status: Home / Self Care | Payer: Self-pay | Source: Ambulatory Visit | Attending: Oncology | Admitting: Oncology

## 2023-09-29 DIAGNOSIS — Z006 Encounter for examination for normal comparison and control in clinical research program: Secondary | ICD-10-CM | POA: Insufficient documentation

## 2023-10-02 ENCOUNTER — Other Ambulatory Visit: Payer: Self-pay | Admitting: Internal Medicine

## 2023-10-13 LAB — GENECONNECT MOLECULAR SCREEN: Genetic Analysis Overall Interpretation: NEGATIVE

## 2023-12-02 ENCOUNTER — Other Ambulatory Visit: Payer: Self-pay | Admitting: Internal Medicine

## 2023-12-10 ENCOUNTER — Other Ambulatory Visit: Payer: Medicare PPO

## 2023-12-12 ENCOUNTER — Other Ambulatory Visit: Payer: Self-pay | Admitting: Internal Medicine

## 2024-02-15 ENCOUNTER — Other Ambulatory Visit: Payer: Self-pay

## 2024-02-15 ENCOUNTER — Other Ambulatory Visit: Payer: Self-pay | Admitting: Internal Medicine

## 2024-04-25 ENCOUNTER — Other Ambulatory Visit: Payer: Self-pay | Admitting: Internal Medicine

## 2024-04-29 ENCOUNTER — Other Ambulatory Visit: Payer: Self-pay | Admitting: Internal Medicine

## 2024-05-03 ENCOUNTER — Other Ambulatory Visit: Payer: Self-pay

## 2024-05-03 ENCOUNTER — Other Ambulatory Visit: Payer: Self-pay | Admitting: Internal Medicine

## 2024-05-03 DIAGNOSIS — E039 Hypothyroidism, unspecified: Secondary | ICD-10-CM

## 2024-05-03 MED ORDER — LEVOTHYROXINE SODIUM 50 MCG PO TABS: ORAL_TABLET | ORAL | 0 refills | Status: DC

## 2024-05-09 ENCOUNTER — Encounter: Payer: Self-pay | Admitting: Internal Medicine

## 2024-05-16 ENCOUNTER — Encounter: Payer: Self-pay | Admitting: Internal Medicine

## 2024-05-16 ENCOUNTER — Telehealth: Payer: Self-pay

## 2024-05-16 ENCOUNTER — Ambulatory Visit: Admitting: Internal Medicine

## 2024-05-16 VITALS — BP 130/80 | HR 88 | Ht 62.5 in | Wt 188.0 lb

## 2024-05-16 DIAGNOSIS — Z803 Family history of malignant neoplasm of breast: Secondary | ICD-10-CM | POA: Diagnosis not present

## 2024-05-16 DIAGNOSIS — Z9189 Other specified personal risk factors, not elsewhere classified: Secondary | ICD-10-CM

## 2024-05-16 NOTE — Progress Notes (Signed)
 Patient Care Team: Perri Ronal PARAS, MD as PCP - General (Internal Medicine)  Visit Date: 05/16/24  Subjective:   Chief Complaint  Patient presents with   Family hx of breast cancer    Her sister passed away from breast cancer at 74 years old, her daughter was diagnosed with breast cancer at 74 years old last week. Patient would like to have a breast MRI.    Patient PI:Mary Murillo,Female DOB:[birth year],73 y.o. FMW:[id number]   73 y.o.Female presents today for acute visit with Family History of Breast Cancer in Sister (dx'ed age 48, S/p Bilateral Mastectomy, deceased age 64 also w/ Liver Cancer); Daughter (dx'ed age 83 w/ invasive ductal carcinoma). Her last mammogram was 08/20/2023, which was normal with note that breasts are heterogeneously dense, which may obscure small masses. Discussed referral to Oncology for consultation. She mentions hx of cyst within left breast and also constant discomfort in left breast.   Past Medical History:  Diagnosis Date   Allergy    hx of seasonal allergies   Anxiety    on meds   Arthritis    osteoarthritis in bilateral knees   Asthma    hx of-no meds after dropping wheat and dairy   Depression    on meds   GERD (gastroesophageal reflux disease)    Hyperlipidemia    Hypothyroidism    on meds   PONV (postoperative nausea and vomiting)    vomiting x1 s/p hysterectomy in 1990s   Vitamin D  deficiency    on meds    Allergies  Allergen Reactions   Lactose Intolerance (Gi) Shortness Of Breath    congestion   Terazol [Terconazole]     Systemic reaction   Wheat Shortness Of Breath    congestion   Nickel    Other Itching and Rash    Stainless steel- SURGICAL STAPLES -     Family History  Problem Relation Age of Onset   Testicular cancer Father 11   Breast cancer Sister 23   Liver cancer Sister 39       mets from breast   Colon polyps Neg Hx    Colon cancer Neg Hx    Esophageal cancer Neg Hx    Stomach cancer Neg Hx    Rectal cancer  Neg Hx    Social Hx: Married. Adult children. Husband is a retired Runner, broadcasting/film/video. Patient is an Landscape architect. Nonsmoker.  Review of Systems  Skin:        (+) Chronic Left Breast Tenderness  All other systems reviewed and are negative.    Objective:  Vitals: BP 130/80   Pulse 88   Ht 5' 2.5 (1.588 m)   Wt 188 lb (85.3 kg)   SpO2 96%   BMI 33.84 kg/m   Physical Exam Vitals and nursing note reviewed.  Constitutional:      General: She is not in acute distress.    Appearance: Normal appearance. She is not toxic-appearing.  HENT:     Head: Normocephalic and atraumatic.  Pulmonary:     Effort: Pulmonary effort is normal.  Chest:  Breasts:    Right: Normal.     Left: Normal.     Comments: Thickening at 12 o'clock left breast, no discrete masses or other suspicious areas appreciated. Mild tenderness noted on palpation to left breast, which she's reportedly had for years. Skin:    General: Skin is warm and dry.  Neurological:     Mental Status: She is alert and oriented to person,  place, and time. Mental status is at baseline.  Psychiatric:        Mood and Affect: Mood normal.        Behavior: Behavior normal.        Thought Content: Thought content normal.        Judgment: Judgment normal.     Results:  Studies Obtained And Personally Reviewed By Me: Labs:     Component Value Date/Time   NA 144 09/14/2023 0942   K 5.2 09/14/2023 0942   CL 108 09/14/2023 0942   CO2 26 09/14/2023 0942   GLUCOSE 101 (H) 09/14/2023 0942   BUN 7 09/14/2023 0942   CREATININE 0.83 09/14/2023 0942   CALCIUM  10.0 09/14/2023 0942   PROT 7.1 09/14/2023 0942   ALBUMIN 4.1 06/25/2017 1119   AST 17 09/14/2023 0942   ALT 16 09/14/2023 0942   ALKPHOS 94 06/25/2017 1119   BILITOT 1.1 09/14/2023 0942   GFRNONAA >60 01/16/2021 0321   GFRNONAA 75 08/14/2020 0903   GFRAA 87 08/14/2020 0903    Lab Results  Component Value Date   WBC 5.6 09/14/2023   HGB 15.1 09/14/2023   HCT 45.7 (H) 09/14/2023    MCV 93.5 09/14/2023   PLT 377 09/14/2023   Lab Results  Component Value Date   CHOL 213 (H) 09/14/2023   HDL 53 09/14/2023   LDLCALC 137 (H) 09/14/2023   TRIG 112 09/14/2023   CHOLHDL 4.0 09/14/2023   Lab Results  Component Value Date   HGBA1C 5.6 01/03/2021    Lab Results  Component Value Date   TSH 2.76 09/14/2023    Assessment & Plan:   Family History of Breast Cancer: Fhx in sister, who was dx'ed age 78, underwent a bilateral mastectomy, but unfortunately passed away at age 16, also having been diagnosed with liver cancer and breast cancer in daughter was recently dx'ed at age 57 with invasive ductal carcinoma. Patient's last mammogram was 08/20/2023, which was normal with note that breasts are heterogeneously dense, which may obscure small masses. She herself has person hx of cyst within left breast and also constant discomfort in left breast. On exam, thickening noted in left breast at 12 o'clock with tenderness on palpation, which is apparently a chronic issue according to patient. Discussed referral to Oncology for consultation - Oncology contacted for appointment.      I,Emily Lagle,acting as a Neurosurgeon for Ronal [name], MD.,have documented all relevant documentation on the behalf of Ronal [name], MD,as directed by  Ronal [name], MD while in the presence of Ronal [name], MD.   I, Ronal [name], MD, have reviewed all documentation for this visit. The documentation on 05/16/24 for the exam, diagnosis, procedures, and orders are all accurate and complete.

## 2024-05-16 NOTE — Patient Instructions (Addendum)
 Patient tells us  she has learned her daughter has been diagnosed with breast cancer and lives in [place] , [place] area. This is disturbing to patient and she would like to know her risk for breast cancer. Make referral to [facility].

## 2024-05-16 NOTE — Telephone Encounter (Signed)
 I have called the cancer center/oncology at [phone] and left a voicemail with the new patient specialist. Patient needs to be seen for generic counseling for family hx of breast cancer, her daughter has breast cancer.

## 2024-05-17 ENCOUNTER — Other Ambulatory Visit: Payer: Self-pay

## 2024-05-17 DIAGNOSIS — Z7183 Encounter for nonprocreative genetic counseling: Secondary | ICD-10-CM

## 2024-05-20 DIAGNOSIS — L814 Other melanin hyperpigmentation: Secondary | ICD-10-CM | POA: Diagnosis not present

## 2024-05-20 DIAGNOSIS — D485 Neoplasm of uncertain behavior of skin: Secondary | ICD-10-CM | POA: Diagnosis not present

## 2024-05-20 DIAGNOSIS — D225 Melanocytic nevi of trunk: Secondary | ICD-10-CM | POA: Diagnosis not present

## 2024-05-20 DIAGNOSIS — L821 Other seborrheic keratosis: Secondary | ICD-10-CM | POA: Diagnosis not present

## 2024-05-20 DIAGNOSIS — L820 Inflamed seborrheic keratosis: Secondary | ICD-10-CM | POA: Diagnosis not present

## 2024-05-20 DIAGNOSIS — L739 Follicular disorder, unspecified: Secondary | ICD-10-CM | POA: Diagnosis not present

## 2024-06-06 ENCOUNTER — Telehealth: Payer: Self-pay

## 2024-06-06 NOTE — Telephone Encounter (Signed)
 Pt verbally confirmed appt for 8/5

## 2024-06-07 ENCOUNTER — Inpatient Hospital Stay: Attending: Hematology and Oncology | Admitting: Hematology and Oncology

## 2024-06-07 ENCOUNTER — Inpatient Hospital Stay

## 2024-06-07 VITALS — BP 144/83 | HR 75 | Temp 98.7°F | Resp 18 | Ht 62.5 in | Wt 186.8 lb

## 2024-06-07 DIAGNOSIS — Z800 Family history of malignant neoplasm of digestive organs: Secondary | ICD-10-CM | POA: Diagnosis not present

## 2024-06-07 DIAGNOSIS — Z9189 Other specified personal risk factors, not elsewhere classified: Secondary | ICD-10-CM

## 2024-06-07 DIAGNOSIS — Z79899 Other long term (current) drug therapy: Secondary | ICD-10-CM | POA: Diagnosis not present

## 2024-06-07 DIAGNOSIS — N6310 Unspecified lump in the right breast, unspecified quadrant: Secondary | ICD-10-CM | POA: Insufficient documentation

## 2024-06-07 DIAGNOSIS — Z87891 Personal history of nicotine dependence: Secondary | ICD-10-CM | POA: Insufficient documentation

## 2024-06-07 DIAGNOSIS — Z803 Family history of malignant neoplasm of breast: Secondary | ICD-10-CM | POA: Diagnosis not present

## 2024-06-07 DIAGNOSIS — E039 Hypothyroidism, unspecified: Secondary | ICD-10-CM | POA: Insufficient documentation

## 2024-06-07 DIAGNOSIS — Z79890 Hormone replacement therapy (postmenopausal): Secondary | ICD-10-CM | POA: Diagnosis not present

## 2024-06-07 DIAGNOSIS — N6320 Unspecified lump in the left breast, unspecified quadrant: Secondary | ICD-10-CM | POA: Insufficient documentation

## 2024-06-07 DIAGNOSIS — E785 Hyperlipidemia, unspecified: Secondary | ICD-10-CM | POA: Insufficient documentation

## 2024-06-07 DIAGNOSIS — Z90710 Acquired absence of both cervix and uterus: Secondary | ICD-10-CM | POA: Diagnosis not present

## 2024-06-07 DIAGNOSIS — Z8043 Family history of malignant neoplasm of testis: Secondary | ICD-10-CM | POA: Diagnosis not present

## 2024-06-07 NOTE — Progress Notes (Unsigned)
 [facility] CONSULT NOTE  Patient Care Team: Baxley, Ronal PARAS, MD as PCP - General (Internal Medicine)  CHIEF COMPLAINTS/PURPOSE OF CONSULTATION:    ASSESSMENT & PLAN:   We discussed her life time risk of breast cancer based on TC model which is 16%. Although her life time risk o BC is less than 20%, she has C density breasts and very strong family history of BC. Her sister likely died from met breast cancer at the age of 14 and her daughter was most recently diagnosed with BC at the age of 74.       She is interested in intensive screening, so I gave her possible options. We can see if MR breast will be covered, if not she can consider abbreviated MRI or contrast enhanced mammogram. She wants to see if MRI will be approved.  2. Lifestyle modification: We discussed different interventions exercise at least 5 days a week including both aerobic as well as weight-bearing exercises and strength training. We discussed dietary modification including increasing number of servings of fruit and vegetables as well as decreased in number of servings of meat. We discussed the importance of maintaining a good BMI and limiting alcohol intake.  3. Chemoprevention: She is not interested,  4. Genetics: Genetic testing neg for BRCA 1 and BRCA 2. She is awaiting her daughter's results.  5. Followup: Patient will be seen in 1 yr. She should see a provider for breast exam every 6 months SBE recommended monthly. If her daughter has any pertinent genetic mutation, she should consider comprehensive genetic testing.   HISTORY OF PRESENTING ILLNESS:  Mary Murillo 73 y.o. female is here because of high risk for [facility]  Discussed the use of AI scribe software for clinical note transcription with the patient, who gave verbal consent to proceed.  History of Present Illness Mary Murillo is a 73 year old female who presents for evaluation of her breast cancer risk.  She is concerned about her breast cancer  risk due to a significant family history. Her sister was diagnosed with breast cancer at 46, postpartum and associated with nursing, initially as DCIS, and underwent a mastectomy. The cancer may have later metastasized to her liver or primary liver cancer, and her sister passed away shortly after. Her daughter was recently diagnosed with breast cancer, underwent a lumpectomy, and had two lymph nodes removed, one of which was infected. The cancer is triple positive.  She has been diligent about her breast health, getting annual mammograms since her sister's diagnosis. She has dense breasts, classified as category C, and has had a benign breast aspiration in the past. She experiences pain under her arms and describes her breasts as 'lumpy' and fibrous. She has undergone genetic testing for BRCA mutations through a [employer] test and tested negative.  Her family history is significant for cancer. Her sister died from breast cancer, her daughter has been diagnosed with breast cancer, her father had testicular cancer, a cousin died of pancreatic cancer, and another cousin has breast cancer. She has two daughters, one with breast cancer and the other who had breast reduction surgery.  She has a history of hypothyroidism, for which she takes levothyroxine . She had a hysterectomy at age 20 due to heavy bleeding but retained her ovaries. 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 She quit smoking in her early twenties and no longer consumes alcohol. She engages in yoga and water  exercises regularly.  All  other systems were reviewed with the patient and are negative.  MEDICAL HISTORY:  Past Medical History:  Diagnosis Date   Allergy    hx of seasonal allergies   Anxiety    on meds   Arthritis    osteoarthritis in bilateral knees   Asthma    hx of-no meds after dropping wheat and dairy   Depression    on meds   GERD (gastroesophageal reflux disease)     Hyperlipidemia    Hypothyroidism    on meds   PONV (postoperative nausea and vomiting)    vomiting x1 s/p hysterectomy in 1990s   Vitamin D  deficiency    on meds    SURGICAL HISTORY: Past Surgical History:  Procedure Laterality Date   BREAST CYST ASPIRATION Left    CESAREAN SECTION  1982   COLONOSCOPY  2011   DB-hems/TICS   TONSILLECTOMY/ADENOIDECTOMY/TURBINATE REDUCTION     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   TOTAL KNEE ARTHROPLASTY Left 04/16/2021   Procedure: TOTAL KNEE ARTHROPLASTY;  Surgeon: Ernie Cough, MD;  Location: [facility];  Service: Orthopedics;  Laterality: Left;  70 mins *Has a Nickel Allergy!   VAGINAL HYSTERECTOMY  1991   WISDOM TOOTH EXTRACTION      SOCIAL HISTORY: Social History   Socioeconomic History   Marital status: Married    Spouse name: Not on file   Number of children: Not on file   Years of education: Not on file   Highest education level: Bachelor's degree (e.g., BA, AB, BS)  Occupational History   Not on file  Tobacco Use   Smoking status: Former   Smokeless tobacco: Never   Tobacco comments:    quit in 20's   Vaping Use   Vaping status: Never Used  Substance and Sexual Activity   Alcohol use: Yes    Alcohol/week: 4.0 standard drinks of alcohol    Types: 2 Glasses of wine, 2 Standard drinks or equivalent per week    Comment: occasional at dinner   Drug use: No   Sexual activity: Not Currently  Other Topics Concern   Not on file  Social History Narrative   Not on file   Social Drivers of Health   Financial Resource Strain: Low Risk  (09/14/2023)   Overall Financial Resource Strain (CARDIA)    Difficulty of Paying Living Expenses: Not hard at all  Food Insecurity: No Food Insecurity (09/14/2023)   Hunger Vital Sign    Worried About Running Out of Food in the Last  Year: Never true    Ran Out of Food in the Last Year: Never true  Transportation Needs: No Transportation Needs (09/14/2023)   PRAPARE - Administrator, Civil Service (Medical): No    Lack of Transportation (Non-Medical): No  Physical Activity: Sufficiently Active (09/14/2023)   Exercise Vital Sign    Days of Exercise per Week: 5 days    Minutes of Exercise per Session: 60 min  Stress: No Stress Concern Present (09/14/2023)   [employer] of Occupational Health - Occupational Stress Questionnaire    Feeling of Stress : Not at all  Social Connections: Socially Integrated (09/14/2023)   Social Connection and Isolation Panel    Frequency of Communication with Friends and Family: More than three times a week    Frequency of Social Gatherings with Friends  and Family: More than three times a week    Attends Religious Services: More than 4 times per year    Active Member of Clubs or Organizations: Yes    Attends Banker Meetings: More than 4 times per year    Marital Status: Married  Catering manager Violence: Not At Risk (09/16/2023)   Humiliation, Afraid, Rape, and Kick questionnaire    Fear of Current or Ex-Partner: No    Emotionally Abused: No    Physically Abused: No    Sexually Abused: No    FAMILY HISTORY: Family History  Problem Relation Age of Onset   Testicular cancer Father 71   Breast cancer Sister 44   Liver cancer Sister 39       mets from breast   Colon polyps Neg Hx    Colon cancer Neg Hx    Esophageal cancer Neg Hx    Stomach cancer Neg Hx    Rectal cancer Neg Hx     ALLERGIES:  is allergic to lactose intolerance (gi), terazol [terconazole], wheat, nickel, and other.  MEDICATIONS:  Current Outpatient Medications  Medication Sig Dispense Refill   atorvastatin  (LIPITOR) 10 MG tablet TAKE 1 TABLET BY MOUTH EVERY DAY 90 tablet 0   CALCIUM  PO Take 2 tablets by mouth 2 (two) times daily with a meal.     Camphor-Menthol -Capsicum (TIGER  BALM PAIN RELIEVING) 80-24-16 MG PTCH Apply 1 patch topically daily as needed (pain).     celecoxib  (CELEBREX ) 200 MG capsule Take 1 capsule (200 mg total) by mouth 2 (two) times daily. (Patient not taking: Reported on 08/10/2023) 60 capsule 3   Cholecalciferol (VITAMIN D3) LIQD Place 4,000 Units under the tongue daily.     CRANBERRY PO Take 1 tablet by mouth at bedtime.     Digestive Enzyme CAPS Take 1 capsule by mouth daily with supper.     escitalopram  (LEXAPRO ) 20 MG tablet TAKE 1 TABLET BY MOUTH EVERY DAY 90 tablet 3   fish oil-omega-3 fatty acids 1000 MG capsule Take 2 g by mouth daily.     Ginkgo Biloba (GINKGO PO) Take 1 capsule by mouth daily.     hydrocortisone  (ANUSOL -HC) 25 MG suppository Place 1 suppository (25 mg total) rectally at bedtime. (Patient not taking: Reported on 01/01/2023) 12 suppository 3   levothyroxine  (SYNTHROID ) 50 MCG tablet TAKE 1 TABLET BY MOUTH EVERY DAY BEFORE BREAKFAST 90 tablet 0   levothyroxine  (SYNTHROID ) 50 MCG tablet TAKE 1 TABLET BY MOUTH EVERY DAY BEFORE BREAKFAST 90 tablet 0   nystatin  (MYCOSTATIN ) 500000 units TABS tablet TAKE 1 TABLET BY MOUTH DAILY AS NEEDED FOR YEAST INFECTIONS 42 tablet 0   OVER THE COUNTER MEDICATION Take 1-2 tablets by mouth See admin instructions. Cholesterol Regulator 2 - Take 1 tablet at breakfast and 2 tablets at dinner     Probiotic Product (PROBIOTIC PO) Take 2 capsules by mouth at bedtime.     No current facility-administered medications for this visit.     PHYSICAL EXAMINATION: ECOG PERFORMANCE STATUS: 0 - Asymptomatic  Vitals:   06/07/24 1423 06/07/24 1424  BP: (!) 140/92 (!) 144/83  Pulse: 75   Resp: 18   Temp: 98.7 F (37.1 C)   SpO2: 98%    Filed Weights   06/07/24 1423  Weight: 186 lb 12.8 oz (84.7 kg)    GENERAL:alert, no distress and comfortable Bilateral breasts inspected. Scattered density, inverted nipple on the right side, likely chronic No palpable masses. No regional  adenopathy.  LABORATORY DATA:  I have reviewed the data as listed Lab Results  Component Value Date   WBC 5.6 09/14/2023   HGB 15.1 09/14/2023   HCT 45.7 (H) 09/14/2023   MCV 93.5 09/14/2023   PLT 377 09/14/2023     Chemistry      Component Value Date/Time   NA 144 09/14/2023 0942   K 5.2 09/14/2023 0942   CL 108 09/14/2023 0942   CO2 26 09/14/2023 0942   BUN 7 09/14/2023 0942   CREATININE 0.83 09/14/2023 0942      Component Value Date/Time   CALCIUM  10.0 09/14/2023 0942   ALKPHOS 94 06/25/2017 1119   AST 17 09/14/2023 0942   ALT 16 09/14/2023 0942   BILITOT 1.1 09/14/2023 0942       RADIOGRAPHIC STUDIES: I have personally reviewed the radiological images as listed and agreed with the findings in the report. No results found.  All questions were answered. The patient knows to call the clinic with any problems, questions or concerns. I spent 45 minutes in the care of this patient including H and P, review of records, counseling and coordination of care.     Amber Stalls, MD 06/07/2024 2:31 PM

## 2024-06-14 ENCOUNTER — Encounter: Payer: Self-pay | Admitting: Hematology and Oncology

## 2024-06-14 NOTE — Addendum Note (Signed)
 Addended by: Chrysa Rampy P on: 06/14/2024 02:47 PM   Modules accepted: Orders

## 2024-06-20 ENCOUNTER — Ambulatory Visit
Admission: RE | Admit: 2024-06-20 | Discharge: 2024-06-20 | Disposition: A | Discharge status: Home / Self Care | Source: Ambulatory Visit | Attending: Hematology and Oncology | Admitting: Hematology and Oncology

## 2024-06-20 DIAGNOSIS — Z803 Family history of malignant neoplasm of breast: Secondary | ICD-10-CM | POA: Diagnosis not present

## 2024-06-20 DIAGNOSIS — Z1239 Encounter for other screening for malignant neoplasm of breast: Secondary | ICD-10-CM | POA: Diagnosis not present

## 2024-06-20 DIAGNOSIS — Z9189 Other specified personal risk factors, not elsewhere classified: Secondary | ICD-10-CM

## 2024-06-20 MED ORDER — GADOPICLENOL 0.5 MMOL/ML IV SOLN
8.0000 mL | Freq: Once | INTRAVENOUS | Status: AC | PRN
  Administered 2024-06-20: 8 mL via INTRAVENOUS

## 2024-07-27 ENCOUNTER — Other Ambulatory Visit: Payer: Self-pay | Admitting: Internal Medicine

## 2024-08-02 ENCOUNTER — Other Ambulatory Visit: Payer: Self-pay | Admitting: Internal Medicine

## 2024-08-02 DIAGNOSIS — E039 Hypothyroidism, unspecified: Secondary | ICD-10-CM

## 2024-08-03 MED ORDER — LEVOTHYROXINE SODIUM 50 MCG PO TABS: 50.0000 ug | ORAL_TABLET | Freq: Every day | ORAL | 3 refills | Status: AC

## 2024-08-03 NOTE — Addendum Note (Signed)
 Addended by: Dilara Navarrete P on: 08/03/2024 02:05 PM   Modules accepted: Orders

## 2024-09-15 ENCOUNTER — Other Ambulatory Visit: Payer: Medicare PPO

## 2024-09-15 DIAGNOSIS — M1711 Unilateral primary osteoarthritis, right knee: Secondary | ICD-10-CM | POA: Diagnosis not present

## 2024-09-15 DIAGNOSIS — Z0000 Encounter for general adult medical examination without abnormal findings: Secondary | ICD-10-CM

## 2024-09-15 DIAGNOSIS — E7800 Pure hypercholesterolemia, unspecified: Secondary | ICD-10-CM

## 2024-09-15 DIAGNOSIS — E039 Hypothyroidism, unspecified: Secondary | ICD-10-CM | POA: Diagnosis not present

## 2024-09-15 NOTE — Progress Notes (Signed)
 Annual Wellness Visit   Patient Care Team: Perri Ronal PARAS, MD as PCP - General (Internal Medicine)  Visit Date: 09/20/24   Chief Complaint  Patient presents with   Annual Exam   Medicare Wellness   Subjective:  Patient: Mary Murillo, Female DOB: Jan 09, 1951, 73 y.o. MRN: [id number] Vitals:   09/20/24 1050  BP: 110/70   Mary Murillo is a 73 y.o. Female who presents today for her Annual Wellness Visit. Patient has Hypothyroidism; Hyperlipidemia; Vitamin D  deficiency; Allergic rhinitis; Depression; Insomnia; History of asthma; Osteoarthritis of right knee; Status post total right knee replacement; Obese; and S/P total knee arthroplasty, left on their problem list.  She feels well and her general health is excellent.   History of hyperlipidemia. 09/15/2024 Lipid Panel CHOL 254, LDL 172, otherwise WNL. Has previously taken Rosuvastatin  and Atorvastatin  but discontinued use due to myalgias.   History of Vitamin D  deficiency  treated with Vitamin D3 4,000 units daily.   History of hypothyroidism treated with Levothyroxine  50 mcg daily. 09/15/2024 TSH 2.06.  Had thrombosed hemorrhoid 2004.  History of tonsillectomy and adenoidectomy 1956.  C-section 1982.  Oral mass removed from under her tongue in 2003.  Hysterectomy without oophorectomy 1991.    History of bilateral knee osteoarthritis.  History of musculoskeletal pain treated with Celebrex    Labs 09/15/2024 HCT 46.7, Blood glucose 100, Total Bilirubin, CHOL 254, LDL 172,  WNL    06/20/2024 MR breast scan No MRI evidence of malignancy in bilateral breast.   08/30/2021 Cardiac calcium  score 2   09/11/2023 Colonoscopy The examined portion of the ileum was normal. Two 3 to 4 mm polyps in the transverse colon and in the ascending colon. Resected and retrieved. Pathology found to be benign but precancserous. Diverticulosis in the sigmoid colon, in the descending colon and in the transverse colon. Non- bleeding internal hemorrhoids. Repeat  in 5 years.    Vaccine counseling: Influenza Vaccine received today.   Health Maintenance  Topic Date Due   COVID-19 Vaccine (9 - 2025-26 season) 07/04/2024   Medicare Annual Wellness (AWV)  09/15/2024   Mammogram  06/20/2026   Colonoscopy  09/10/2028   DTaP/Tdap/Td (5 - Td or Tdap) 07/19/2033   Pneumococcal Vaccine: 50+ Years  Completed   Influenza Vaccine  Completed   DEXA SCAN  Completed   Zoster Vaccines- Shingrix  Completed   Meningococcal B Vaccine  Aged Out   Hepatitis C Screening  Discontinued    Review of Systems  Constitutional:  Negative for fever and malaise/fatigue.  HENT:  Negative for congestion.   Eyes:  Negative for blurred vision.  Respiratory:  Negative for cough and shortness of breath.   Cardiovascular:  Negative for chest pain, palpitations and leg swelling.  Gastrointestinal:  Negative for vomiting.  Musculoskeletal:  Negative for back pain.  Skin:  Negative for rash.  Neurological:  Negative for loss of consciousness and headaches.   Objective:  Vitals: body mass index is 32.76 kg/m. Today's Vitals   09/20/24 1050  BP: 110/70  Pulse: 73  SpO2: 94%  Weight: 182 lb (82.6 kg)  Height: 5' 2.5 (1.588 m)  PainSc: 0-No pain   Physical Exam Vitals and nursing note reviewed. Exam conducted with a chaperone present (Araceli Goessel, CMA).  Constitutional:      General: She is not in acute distress.    Appearance: Normal appearance. She is not ill-appearing or toxic-appearing.  HENT:     Head: Normocephalic and atraumatic.     Right  Ear: Hearing, tympanic membrane, ear canal and external ear normal.     Left Ear: Hearing, tympanic membrane, ear canal and external ear normal.     Mouth/Throat:     Pharynx: Oropharynx is clear.  Eyes:     Extraocular Movements: Extraocular movements intact.     Pupils: Pupils are equal, round, and reactive to light.  Neck:     Thyroid : No thyroid  mass, thyromegaly or thyroid  tenderness.     Vascular: No carotid  bruit.  Cardiovascular:     Rate and Rhythm: Normal rate and regular rhythm. No extrasystoles are present.    Pulses:          Dorsalis pedis pulses are 2+ on the right side and 2+ on the left side.     Heart sounds: Normal heart sounds. No murmur heard.    No friction rub. No gallop.  Pulmonary:     Effort: Pulmonary effort is normal.     Breath sounds: Normal breath sounds. No decreased breath sounds, wheezing, rhonchi or rales.  Chest:     Chest wall: No mass.  Abdominal:     Palpations: Abdomen is soft. There is no hepatomegaly, splenomegaly or mass.     Tenderness: There is no abdominal tenderness.     Hernia: No hernia is present.  Musculoskeletal:     Cervical back: Normal range of motion.     Right lower leg: No edema.     Left lower leg: No edema.  Lymphadenopathy:     Cervical: No cervical adenopathy.     Upper Body:     Right upper body: No supraclavicular adenopathy.     Left upper body: No supraclavicular adenopathy.  Skin:    General: Skin is warm and dry.  Neurological:     General: No focal deficit present.     Mental Status: She is alert and oriented to person, place, and time. Mental status is at baseline.     Sensory: Sensation is intact.     Motor: Motor function is intact. No weakness.     Deep Tendon Reflexes: Reflexes are normal and symmetric.  Psychiatric:        Attention and Perception: Attention normal.        Mood and Affect: Mood normal.        Speech: Speech normal.        Behavior: Behavior normal.        Thought Content: Thought content normal.        Cognition and Memory: Cognition normal.        Judgment: Judgment normal.     Current Outpatient Medications  Medication Instructions   CALCIUM  PO 2 tablets, 2 times daily with meals   Camphor-Menthol -Capsicum (TIGER BALM PAIN RELIEVING) 80-24-16 MG PTCH 1 patch, Daily PRN   Cholecalciferol (VITAMIN D3) LIQD 4,000 Units, Daily   CRANBERRY PO 1 tablet, Daily at bedtime   Digestive Enzyme  CAPS 1 capsule, Daily with supper   escitalopram  (LEXAPRO ) 20 mg, Oral, Daily   fish oil-omega-3 fatty acids 2 g, Daily   Ginkgo Biloba (GINKGO PO) 1 capsule, Daily   levothyroxine  (SYNTHROID ) 50 mcg, Oral, Daily   nystatin  (MYCOSTATIN ) 500000 units TABS tablet TAKE 1 TABLET BY MOUTH DAILY AS NEEDED FOR YEAST INFECTIONS   OVER THE COUNTER MEDICATION 1-2 tablets, See admin instructions   Probiotic Product (PROBIOTIC PO) 2 capsules, Nightly   Past Medical History:  Diagnosis Date   Allergy    hx of seasonal allergies  Anxiety    on meds   Arthritis    osteoarthritis in bilateral knees   Asthma    hx of-no meds after dropping wheat and dairy   Depression    on meds   GERD (gastroesophageal reflux disease)    Hyperlipidemia    Hypothyroidism    on meds   PONV (postoperative nausea and vomiting)    vomiting x1 s/p hysterectomy in 1990s   Vitamin D  deficiency    on meds   Medical/Surgical History Narrative:  Allergic/Intolerant to:  Allergies  Allergen Reactions   Lactose Intolerance (Gi) Shortness Of Breath    congestion   Terazol [Terconazole]     Systemic reaction   Wheat Shortness Of Breath    congestion   Nickel    Other Itching and Rash    Stainless steel- SURGICAL STAPLES -    Past Surgical History:  Procedure Laterality Date   BREAST CYST ASPIRATION Left    CESAREAN SECTION  1982   COLONOSCOPY  October 21, 2010   DB-hems/TICS   OVARIAN CYST SURGERY     TONSILLECTOMY/ADENOIDECTOMY/TURBINATE REDUCTION     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   TOTAL KNEE ARTHROPLASTY Left 04/16/2021   Procedure: TOTAL KNEE ARTHROPLASTY;  Surgeon: Ernie Cough, MD;  Location: [facility];  Service: Orthopedics;  Laterality: Left;  70 mins *Has a Nickel Allergy!   VAGINAL HYSTERECTOMY  1991   WISDOM TOOTH EXTRACTION     Family  History  Problem Relation Age of Onset   Cancer Father        Prostate   Breast cancer Sister 37   Liver cancer Sister 81       mets from breast   Breast cancer Daughter 9   Colon polyps Neg Hx    Colon cancer Neg Hx    Esophageal cancer Neg Hx    Stomach cancer Neg Hx    Rectal cancer Neg Hx    Family History Narrative: 1 sister died of metastatic breast cancer.  Parents died in 10-21-2018.  Both parents had dementia.  Father had history of strokes.  Daughter with history of Lyme disease.   Social history: Married with 2 adult daughters.  Does not smoke.  Social alcohol consumption.  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  Husband was a runner, broadcasting/film/video at [employer] but has retired.    Most Recent Health Risks Assessment:   Most Recent Social Determinants of Health (Including Hx of Tobacco, Alcohol, and Drug Use) SDOH Screenings   Food Insecurity: No Food Insecurity (09/20/2024)  Housing: Low Risk  (09/20/2024)  Transportation Needs: No Transportation Needs (09/20/2024)  Utilities: Not At Risk (09/20/2024)  Alcohol Screen: Low Risk  (09/20/2024)  Depression (PHQ2-9): Low Risk  (09/20/2024)  Financial Resource Strain: Low Risk  (09/20/2024)  Physical Activity: Sufficiently Active (09/20/2024)  Social Connections: Socially Integrated (09/20/2024)  Stress: No Stress Concern Present (09/20/2024)  Tobacco Use: Medium Risk (09/20/2024)  Health Literacy: Adequate Health Literacy (09/20/2024)   Social History   Tobacco Use   Smoking status: Former   Smokeless tobacco: Never   Tobacco comments:    quit in Oct 21, 2024   Vaping Use   Vaping status: Never Used  Substance Use Topics   Alcohol use: Yes    Alcohol/week: 4.0 standard drinks  of alcohol    Types: 2 Glasses of wine, 2 Standard drinks or equivalent per week    Comment: occasional at dinner   Drug use: No   Most Recent Functional Status Assessment:    09/14/2024    5:27 PM  In  your present state of health, do you have any difficulty performing the following activities:  Hearing? 0  Vision? 0  Difficulty concentrating or making decisions? 0  Walking or climbing stairs? 0  Dressing or bathing? 0  Doing errands, shopping? 0  Preparing Food and eating ? N  Using the Toilet? N  In the past six months, have you accidently leaked urine? N  Do you have problems with loss of bowel control? N  Managing your Medications? N  Managing your Finances? N  Housekeeping or managing your Housekeeping? N   Most Recent Fall Risk Assessment:    09/20/2024   10:59 AM  Fall Risk   Falls in the past year? 0  Number falls in past yr: 0  Injury with Fall? 0  Risk for fall due to : No Fall Risks  Follow up Education provided;Falls evaluation completed   Most Recent Anxiety/Depression Screenings:    09/20/2024   11:17 AM 05/16/2024   11:24 AM  PHQ 2/9 Scores  PHQ - 2 Score 0 0    Most Recent Cognitive Screening:    09/20/2024   10:59 AM  6CIT Screen  What Year? 0 points  What month? 0 points  What time? 0 points  Count back from 20 0 points  Months in reverse 0 points  Repeat phrase 0 points  Total Score 0 points    Results:  Studies Obtained And Personally Reviewed By Me:   06/20/2024 MR breast scan No MRI evidence of malignancy in bilateral breast.   08/30/2021 Cardiac calcium  score 2   09/11/2023 Colonoscopy The examined portion of the ileum was normal. Two 3 to 4 mm polyps in the transverse colon and in the ascending colon. Resected and retrieved. Pathology found to be benign but precancserous. Diverticulosis in the sigmoid colon, in the descending colon and in the transverse colon. Non- bleeding internal hemorrhoids. Repeat in 5 years.   Labs:  CBC w/ Differential Lab Results  Component Value Date   WBC 5.5 09/15/2024   RBC 5.10 09/15/2024   HGB 15.2 09/15/2024   HCT 46.7 (H) 09/15/2024   PLT 352 09/15/2024   MCV 91.6 09/15/2024   MCH 29.8  09/15/2024   MCHC 32.5 09/15/2024   RDW 12.9 09/15/2024   MPV 10.1 09/15/2024   LYMPHSABS 1,958 08/15/2022   MONOABS 700 06/25/2017   BASOSABS 28 09/15/2024    Comprehensive Metabolic Panel Lab Results  Component Value Date   NA 140 09/15/2024   K 5.2 09/15/2024   CL 105 09/15/2024   CO2 28 09/15/2024   GLUCOSE 100 (H) 09/15/2024   BUN 9 09/15/2024   CREATININE 0.75 09/15/2024   CALCIUM  10.1 09/15/2024   PROT 7.2 09/15/2024   ALBUMIN 4.1 06/25/2017   AST 18 09/15/2024   ALT 17 09/15/2024   ALKPHOS 94 06/25/2017   BILITOT 1.5 (H) 09/15/2024   EGFR 83 09/15/2024   GFRNONAA >60 01/16/2021   Lipid Panel  Lab Results  Component Value Date   CHOL 254 (H) 09/15/2024   HDL 58 09/15/2024   LDLCALC 172 (H) 09/15/2024   TRIG 109 09/15/2024   A1c Lab Results  Component Value Date   HGBA1C 5.6 01/03/2021  TSH Lab Results  Component Value Date   TSH 2.06 09/15/2024    Assessment & Plan:   Orders Placed This Encounter  Procedures   Flu vaccine HIGH DOSE PF(Fluzone Trivalent)   Ambulatory referral to Cardiology    Referral Priority:   Routine    Referral Type:   Consultation    Referral Reason:   Specialty Services Required    Referred to Provider:   Mona Vinie BROCKS, MD    Number of Visits Requested:   1   POCT URINALYSIS DIP (CLINITEK)    Hyperlipidemia: 09/15/2024 Lipid Panel CHOL 254, LDL 172, otherwise WNL. Has previously taken Rosuvastatin  and Atorvastatin  but discontinued use due to myalgias.    Referred to Cardiology to discuss alternative therapy other than statins.  Vitamin D  deficiency: treated with Vitamin D3 4,000 units daily.   Hypothyroidism: treated with Levothyroxine  50 mcg daily. 09/15/2024 TSH 2.06.   06/20/2024 MR breast scan No MRI evidence of malignancy in bilateral breast.   08/30/2021 Coronary calcium  score = 2 which is excellent  09/11/2023 Colonoscopy The examined portion of the ileum was normal. Two 3 to 4 mm polyps in the  transverse colon and in the ascending colon. Resected and retrieved. Pathology found to be benign but precancserous. Diverticulosis in the sigmoid colon, in the descending colon and in the transverse colon. Non- bleeding internal hemorrhoids. Repeat in 5 years.   Vaccine counseling: Influenza vaccine received today.     Annual Wellness Visit done today including the all of the following: Reviewed patient's Family Medical History Reviewed patient's SDOH and reviewed tobacco, alcohol, and drug use.  Reviewed and updated list of patient's medical providers Assessment of cognitive impairment was done Assessed patient's functional ability Established a written schedule for health screening services Health Risk Assessent Completed and Reviewed  Discussed health benefits of physical activity, and encouraged her to engage in regular exercise appropriate for her age and condition.    I,Makayla C Reid,acting as a scribe for Ronal [name], MD.,have documented all relevant documentation on the behalf of Ronal [name], MD,as directed by  Ronal [name], MD while in the presence of Ronal [name], MD.  I, Ronal [name], MD, have reviewed all documentation for and agree with the above Annual Wellness Visit documentation.  Ronal [name], MD Internal Medicine 09/20/2024

## 2024-09-16 LAB — CBC WITH DIFFERENTIAL/PLATELET
Absolute Lymphocytes: 2019 {cells}/uL (ref 850–3900)
Absolute Monocytes: 572 {cells}/uL (ref 200–950)
Basophils Absolute: 28 {cells}/uL (ref 0–200)
Basophils Relative: 0.5 %
Eosinophils Absolute: 83 {cells}/uL (ref 15–500)
Eosinophils Relative: 1.5 %
HCT: 46.7 % — ABNORMAL HIGH (ref 35.0–45.0)
Hemoglobin: 15.2 g/dL (ref 11.7–15.5)
MCH: 29.8 pg (ref 27.0–33.0)
MCHC: 32.5 g/dL (ref 32.0–36.0)
MCV: 91.6 fL (ref 80.0–100.0)
MPV: 10.1 fL (ref 7.5–12.5)
Monocytes Relative: 10.4 %
Neutro Abs: 2800 {cells}/uL (ref 1500–7800)
Neutrophils Relative %: 50.9 %
Platelets: 352 Thousand/uL (ref 140–400)
RBC: 5.1 Million/uL (ref 3.80–5.10)
RDW: 12.9 % (ref 11.0–15.0)
Total Lymphocyte: 36.7 %
WBC: 5.5 Thousand/uL (ref 3.8–10.8)

## 2024-09-16 LAB — COMPREHENSIVE METABOLIC PANEL WITH GFR
AG Ratio: 1.5 (calc) (ref 1.0–2.5)
ALT: 17 U/L (ref 6–29)
AST: 18 U/L (ref 10–35)
Albumin: 4.3 g/dL (ref 3.6–5.1)
Alkaline phosphatase (APISO): 102 U/L (ref 37–153)
BUN: 9 mg/dL (ref 7–25)
CO2: 28 mmol/L (ref 20–32)
Calcium: 10.1 mg/dL (ref 8.6–10.4)
Chloride: 105 mmol/L (ref 98–110)
Creat: 0.75 mg/dL (ref 0.60–1.00)
Globulin: 2.9 g/dL (ref 1.9–3.7)
Glucose, Bld: 100 mg/dL — ABNORMAL HIGH (ref 65–99)
Potassium: 5.2 mmol/L (ref 3.5–5.3)
Sodium: 140 mmol/L (ref 135–146)
Total Bilirubin: 1.5 mg/dL — ABNORMAL HIGH (ref 0.2–1.2)
Total Protein: 7.2 g/dL (ref 6.1–8.1)
eGFR: 83 mL/min/1.73m2 (ref >=60)

## 2024-09-16 LAB — TSH: TSH: 2.06 m[IU]/L (ref 0.40–4.50)

## 2024-09-16 LAB — LIPID PANEL
Cholesterol: 254 mg/dL — ABNORMAL HIGH (ref <200)
HDL: 58 mg/dL (ref >=50)
LDL Cholesterol (Calc): 172 mg/dL — ABNORMAL HIGH
Non-HDL Cholesterol (Calc): 196 mg/dL — ABNORMAL HIGH (ref <130)
Total CHOL/HDL Ratio: 4.4 (calc) (ref <5.0)
Triglycerides: 109 mg/dL (ref <150)

## 2024-09-19 ENCOUNTER — Ambulatory Visit: Payer: Medicare PPO | Admitting: Internal Medicine

## 2024-09-20 ENCOUNTER — Encounter: Payer: Self-pay | Admitting: Internal Medicine

## 2024-09-20 ENCOUNTER — Ambulatory Visit: Admitting: Internal Medicine

## 2024-09-20 VITALS — BP 110/70 | HR 73 | Ht 62.5 in | Wt 182.0 lb

## 2024-09-20 DIAGNOSIS — E785 Hyperlipidemia, unspecified: Secondary | ICD-10-CM

## 2024-09-20 DIAGNOSIS — Z8639 Personal history of other endocrine, nutritional and metabolic disease: Secondary | ICD-10-CM

## 2024-09-20 DIAGNOSIS — E559 Vitamin D deficiency, unspecified: Secondary | ICD-10-CM

## 2024-09-20 DIAGNOSIS — Z0000 Encounter for general adult medical examination without abnormal findings: Secondary | ICD-10-CM | POA: Diagnosis not present

## 2024-09-20 DIAGNOSIS — E7800 Pure hypercholesterolemia, unspecified: Secondary | ICD-10-CM

## 2024-09-20 DIAGNOSIS — E039 Hypothyroidism, unspecified: Secondary | ICD-10-CM | POA: Diagnosis not present

## 2024-09-20 DIAGNOSIS — Z23 Encounter for immunization: Secondary | ICD-10-CM

## 2024-09-20 NOTE — Patient Instructions (Addendum)
 Mary Murillo,  We are referring you to Dr. [facility] to discuss alternative to statin medication. Flu vaccine given. Return in one year or as needed. As always it was a pleasure to see you today.  Thank you for taking the time for your Medicare Wellness Visit. I appreciate your continued commitment to your health goals. Please review the care plan we discussed, and feel free to reach out if I can assist you further.  Please note that Annual Wellness Visits do not include a physical exam. Some assessments may be limited, especially if the visit was conducted virtually. If needed, we may recommend an in-person follow-up with your provider.  Ongoing Care Seeing your primary care provider every 3 to 6 months helps us  monitor your health and provide consistent, personalized care.   Referrals If a referral was made during today's visit and you haven't received any updates within two weeks, please contact the referred provider directly to check on the status.  Recommended Screenings:  Health Maintenance  Topic Date Due   COVID-19 Vaccine (9 - 2025-26 season) 07/04/2024   Medicare Annual Wellness Visit  09/15/2024   Breast Cancer Screening  06/20/2026   Colon Cancer Screening  09/10/2028   DTaP/Tdap/Td vaccine (5 - Td or Tdap) 07/19/2033   Pneumococcal Vaccine for age over 75  Completed   Flu Shot  Completed   DEXA scan (bone density measurement)  Completed   Zoster (Shingles) Vaccine  Completed   Meningitis B Vaccine  Aged Out   Hepatitis C Screening  Discontinued       09/20/2024   10:59 AM  Advanced Directives  Does Patient Have a Medical Advance Directive? Yes  Type of Advance Directive Living will;Healthcare Power of Attorney  Copy of Healthcare Power of Attorney in Chart? No - copy requested    Vision: Annual vision screenings are recommended for early detection of glaucoma, cataracts, and diabetic retinopathy. These exams can also reveal signs of chronic conditions such  as diabetes and high blood pressure.  Dental: Annual dental screenings help detect early signs of oral cancer, gum disease, and other conditions linked to overall health, including heart disease and diabetes.  Please see the attached documents for additional preventive care recommendations.     Next appointment: Follow up in one year for your annual wellness visit    Preventive Care 65 Years and Older, Female Preventive care refers to lifestyle choices and visits with your health care provider that can promote health and wellness. What does preventive care include? A yearly physical exam. This is also called an annual well check. Dental exams once or twice a year. Routine eye exams. Ask your health care provider how often you should have your eyes checked. Personal lifestyle choices, including: Daily care of your teeth and gums. Regular physical activity. Eating a healthy diet. Avoiding tobacco and drug use. Limiting alcohol use. Practicing safe sex. Taking low-dose aspirin  every day. Taking vitamin and mineral supplements as recommended by your health care provider. What happens during an annual well check? The services and screenings done by your health care provider during your annual well check will depend on your age, overall health, lifestyle risk factors, and family history of disease. Counseling  Your health care provider may ask you questions about your: Alcohol use. Tobacco use. Drug use. Emotional well-being. Home and relationship well-being. Sexual activity. Eating habits. History of falls. Memory and ability to understand (cognition). Work and work astronomer. Reproductive health. Screening  You may have  the following tests or measurements: Height, weight, and BMI. Blood pressure. Lipid and cholesterol levels. These may be checked every 5 years, or more frequently if you are over 38 years old. Skin check. Lung cancer screening. You may have this screening  every year starting at age 21 if you have a 30-pack-year history of smoking and currently smoke or have quit within the past 15 years. Fecal occult blood test (FOBT) of the stool. You may have this test every year starting at age 65. Flexible sigmoidoscopy or colonoscopy. You may have a sigmoidoscopy every 5 years or a colonoscopy every 10 years starting at age 81. Hepatitis C blood test. Hepatitis B blood test. Sexually transmitted disease (STD) testing. Diabetes screening. This is done by checking your blood sugar (glucose) after you have not eaten for a while (fasting). You may have this done every 1-3 years. Bone density scan. This is done to screen for osteoporosis. You may have this done starting at age 15. Mammogram. This may be done every 1-2 years. Talk to your health care provider about how often you should have regular mammograms. Talk with your health care provider about your test results, treatment options, and if necessary, the need for more tests. Vaccines  Your health care provider may recommend certain vaccines, such as: Influenza vaccine. This is recommended every year. Tetanus, diphtheria, and acellular pertussis (Tdap, Td) vaccine. You may need a Td booster every 10 years. Zoster vaccine. You may need this after age 23. Pneumococcal 13-valent conjugate (PCV13) vaccine. One dose is recommended after age 78. Pneumococcal polysaccharide (PPSV23) vaccine. One dose is recommended after age 67. Talk to your health care provider about which screenings and vaccines you need and how often you need them. This information is not intended to replace advice given to you by your health care provider. Make sure you discuss any questions you have with your health care provider. Document Released: 11/16/2015 Document Revised: 07/09/2016 Document Reviewed: 08/21/2015 Elsevier Interactive Patient Education  2017 [employer].  Fall Prevention in the Home Falls can cause injuries. They can  happen to people of all ages. There are many things you can do to make your home safe and to help prevent falls. What can I do on the outside of my home? Regularly fix the edges of walkways and driveways and fix any cracks. Remove anything that might make you trip as you walk through a door, such as a raised step or threshold. Trim any bushes or trees on the path to your home. Use bright outdoor lighting. Clear any walking paths of anything that might make someone trip, such as rocks or tools. Regularly check to see if handrails are loose or broken. Make sure that both sides of any steps have handrails. Any raised decks and porches should have guardrails on the edges. Have any leaves, snow, or ice cleared regularly. Use sand or salt on walking paths during winter. Clean up any spills in your garage right away. This includes oil or grease spills. What can I do in the bathroom? Use night lights. Install grab bars by the toilet and in the tub and shower. Do not use towel bars as grab bars. Use non-skid mats or decals in the tub or shower. If you need to sit down in the shower, use a plastic, non-slip stool. Keep the floor dry. Clean up any water  that spills on the floor as soon as it happens. Remove soap buildup in the tub or shower regularly. Attach bath mats  securely with double-sided non-slip rug tape. Do not have throw rugs and other things on the floor that can make you trip. What can I do in the bedroom? Use night lights. Make sure that you have a light by your bed that is easy to reach. Do not use any sheets or blankets that are too big for your bed. They should not hang down onto the floor. Have a firm chair that has side arms. You can use this for support while you get dressed. Do not have throw rugs and other things on the floor that can make you trip. What can I do in the kitchen? Clean up any spills right away. Avoid walking on wet floors. Keep items that you use a lot in  easy-to-reach places. If you need to reach something above you, use a strong step stool that has a grab bar. Keep electrical cords out of the way. Do not use floor polish or wax that makes floors slippery. If you must use wax, use non-skid floor wax. Do not have throw rugs and other things on the floor that can make you trip. What can I do with my stairs? Do not leave any items on the stairs. Make sure that there are handrails on both sides of the stairs and use them. Fix handrails that are broken or loose. Make sure that handrails are as long as the stairways. Check any carpeting to make sure that it is firmly attached to the stairs. Fix any carpet that is loose or worn. Avoid having throw rugs at the top or bottom of the stairs. If you do have throw rugs, attach them to the floor with carpet tape. Make sure that you have a light switch at the top of the stairs and the bottom of the stairs. If you do not have them, ask someone to add them for you. What else can I do to help prevent falls? Wear shoes that: Do not have high heels. Have rubber bottoms. Are comfortable and fit you well. Are closed at the toe. Do not wear sandals. If you use a stepladder: Make sure that it is fully opened. Do not climb a closed stepladder. Make sure that both sides of the stepladder are locked into place. Ask someone to hold it for you, if possible. Clearly mark and make sure that you can see: Any grab bars or handrails. First and last steps. Where the edge of each step is. Use tools that help you move around (mobility aids) if they are needed. These include: Canes. Walkers. Scooters. Crutches. Turn on the lights when you go into a dark area. Replace any light bulbs as soon as they burn out. Set up your furniture so you have a clear path. Avoid moving your furniture around. If any of your floors are uneven, fix them. If there are any pets around you, be aware of where they are. Review your medicines  with your doctor. Some medicines can make you feel dizzy. This can increase your chance of falling. Ask your doctor what other things that you can do to help prevent falls. This information is not intended to replace advice given to you by your health care provider. Make sure you discuss any questions you have with your health care provider. Document Released: 08/16/2009 Document Revised: 03/27/2016 Document Reviewed: 11/24/2014 Elsevier Interactive Patient Education  2017 [employer].

## 2024-09-20 NOTE — Progress Notes (Signed)
 Chief Complaint  Patient presents with   Annual Exam   Medicare Wellness     Subjective:   Mary Murillo is a 73 y.o. female who presents for a Medicare Annual Wellness Visit.  Allergies (verified) Lactose intolerance (gi), Terazol [terconazole], Wheat, Nickel, and Other   History: Past Medical History:  Diagnosis Date   Allergy    hx of seasonal allergies   Anxiety    on meds   Arthritis    osteoarthritis in bilateral knees   Asthma    hx of-no meds after dropping wheat and dairy   Depression    on meds   GERD (gastroesophageal reflux disease)    Hyperlipidemia    Hypothyroidism    on meds   PONV (postoperative nausea and vomiting)    vomiting x1 s/p hysterectomy in 1990s   Vitamin D  deficiency    on meds   Past Surgical History:  Procedure Laterality Date   BREAST CYST ASPIRATION Left    CESAREAN SECTION  1982   COLONOSCOPY  2011   DB-hems/TICS   OVARIAN CYST SURGERY     TONSILLECTOMY/ADENOIDECTOMY/TURBINATE REDUCTION     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   TOTAL KNEE ARTHROPLASTY Left 04/16/2021   Procedure: TOTAL KNEE ARTHROPLASTY;  Surgeon: Ernie Cough, MD;  Location: [facility];  Service: Orthopedics;  Laterality: Left;  70 mins *Has a Nickel Allergy!   VAGINAL HYSTERECTOMY  1991   WISDOM TOOTH EXTRACTION     Family History  Problem Relation Age of Onset   Cancer Father        Prostate   Breast cancer Sister 50   Liver cancer Sister 65       mets from breast   Breast cancer Daughter 29   Colon polyps Neg Hx    Colon cancer Neg Hx    Esophageal cancer Neg Hx    Stomach cancer Neg Hx    Rectal cancer Neg Hx    Social History   Occupational History   Not on file  Tobacco Use   Smoking status: Former   Smokeless tobacco: Never   Tobacco comments:    quit in 20's   Vaping Use    Vaping status: Never Used  Substance and Sexual Activity   Alcohol use: Yes    Alcohol/week: 4.0 standard drinks of alcohol    Types: 2 Glasses of wine, 2 Standard drinks or equivalent per week    Comment: occasional at dinner   Drug use: No   Sexual activity: Not Currently   Tobacco Counseling Counseling given: No Tobacco comments: quit in 20's   SDOH Screenings   Food Insecurity: No Food Insecurity (09/20/2024)  Housing: Low Risk  (09/20/2024)  Transportation Needs: No Transportation Needs (09/20/2024)  Utilities: Not At Risk (09/20/2024)  Alcohol Screen: Low Risk  (09/20/2024)  Depression (PHQ2-9): Low Risk  (09/20/2024)  Financial Resource Strain: Low Risk  (09/20/2024)  Physical Activity: Sufficiently Active (09/20/2024)  Social Connections: Socially Integrated (09/20/2024)  Stress: No Stress Concern Present (09/20/2024)  Tobacco Use: Medium Risk (09/20/2024)  Health Literacy: Adequate Health Literacy (09/20/2024)   See flowsheets for full screening details  Depression Screen PHQ 2 & 9 Depression Scale- Over the past 2 weeks, how often have you been bothered by any of the following problems? Little interest or pleasure in doing  things: 0 Feeling down, depressed, or hopeless (PHQ Adolescent also includes...irritable): 0 PHQ-2 Total Score: 0     Goals Addressed   None    Visit info / Clinical Intake: Medicare Wellness Visit Type:: Subsequent Annual Wellness Visit Persons participating in visit:: patient Medicare Wellness Visit Mode:: In-person (required for WTM) Information given by:: patient Interpreter Needed?: No Pre-visit prep was completed: yes AWV questionnaire completed by patient prior to visit?: yes Date:: 09/14/24 Living arrangements:: lives with spouse/significant other Patient's Overall Health Status Rating: excellent Typical amount of pain: none Does pain affect daily life?: no Are you currently prescribed opioids?: no  Dietary Habits and  Nutritional Risks How many meals a day?: 3 Eats fruit and vegetables daily?: yes Most meals are obtained by: preparing own meals; eating out In the last 2 weeks, have you had any of the following?: none Diabetic:: no  Functional Status Activities of Daily Living (to include ambulation/medication): Independent Ambulation: Independent Medication Administration: Independent Home Management: Independent Manage your own finances?: yes Primary transportation is: driving Concerns about vision?: no *vision screening is required for WTM* Concerns about hearing?: no  Fall Screening Falls in the past year?: 0 Number of falls in past year: 0 Was there an injury with Fall?: 0 Fall Risk Category Calculator: 0 Patient Fall Risk Level: Low Fall Risk  Fall Risk Patient at Risk for Falls Due to: No Fall Risks Fall risk Follow up: Education provided; Falls evaluation completed  Home and Transportation Safety: All rugs have non-skid backing?: yes All stairs or steps have railings?: yes Grab bars in the bathtub or shower?: (!) no Have non-skid surface in bathtub or shower?: yes Good home lighting?: yes Regular seat belt use?: yes Hospital stays in the last year:: no  Cognitive Assessment Difficulty concentrating, remembering, or making decisions? : no Will 6CIT or Mini Cog be Completed: yes What year is it?: 0 points What month is it?: 0 points About what time is it?: 0 points Count backwards from 20 to 1: 0 points Say the months of the year in reverse: 0 points Repeat the address phrase from earlier: 0 points 6 CIT Score: 0 points  Advance Directives (For Healthcare) Does Patient Have a Medical Advance Directive?: Yes Type of Advance Directive: Living will; Healthcare Power of Attorney Copy of Healthcare Power of Attorney in Chart?: No - copy requested Copy of Living Will in Chart?: No - copy requested  Reviewed/Updated  Reviewed/Updated: Reviewed All (Medical, Surgical, Family,  Medications, Allergies, Care Teams, Patient Goals)        Objective:    Today's Vitals   09/20/24 1050  BP: 110/70  Pulse: 73  SpO2: 94%  Weight: 182 lb (82.6 kg)  Height: 5' 2.5 (1.588 m)  PainSc: 0-No pain   Body mass index is 32.76 kg/m.  Current Medications (verified) Outpatient Encounter Medications as of 09/20/2024  Medication Sig   CALCIUM  PO Take 2 tablets by mouth 2 (two) times daily with a meal.   Camphor-Menthol -Capsicum (TIGER BALM PAIN RELIEVING) 80-24-16 MG PTCH Apply 1 patch topically daily as needed (pain).   Cholecalciferol (VITAMIN D3) LIQD Place 4,000 Units under the tongue daily.   CRANBERRY PO Take 1 tablet by mouth at bedtime.   Digestive Enzyme CAPS Take 1 capsule by mouth daily with supper.   escitalopram  (LEXAPRO ) 20 MG tablet TAKE 1 TABLET BY MOUTH EVERY DAY   fish oil-omega-3 fatty acids 1000 MG capsule Take 2 g by mouth daily.   Ginkgo Biloba (GINKGO PO) Take  1 capsule by mouth daily.   levothyroxine  (SYNTHROID ) 50 MCG tablet Take 1 tablet (50 mcg total) by mouth daily.   nystatin  (MYCOSTATIN ) 500000 units TABS tablet TAKE 1 TABLET BY MOUTH DAILY AS NEEDED FOR YEAST INFECTIONS   OVER THE COUNTER MEDICATION Take 1-2 tablets by mouth See admin instructions. Cholesterol Regulator 2 - Take 1 tablet at breakfast and 2 tablets at dinner   Probiotic Product (PROBIOTIC PO) Take 2 capsules by mouth at bedtime.   No facility-administered encounter medications on file as of 09/20/2024.   Hearing/Vision screen No results found. Immunizations and Health Maintenance Health Maintenance  Topic Date Due   COVID-19 Vaccine (9 - 2025-26 season) 07/04/2024   Medicare Annual Wellness (AWV)  09/15/2024   Mammogram  06/20/2026   Colonoscopy  09/10/2028   DTaP/Tdap/Td (5 - Td or Tdap) 07/19/2033   Pneumococcal Vaccine: 50+ Years  Completed   Influenza Vaccine  Completed   DEXA SCAN  Completed   Zoster Vaccines- Shingrix  Completed   Meningococcal B Vaccine   Aged Out   Hepatitis C Screening  Discontinued        Assessment/Plan:  This is a routine wellness examination for Mary Murillo.  Patient Care Team: Perri Ronal PARAS, MD as PCP - General (Internal Medicine)  I have personally reviewed and noted the following in the patient's chart:   Medical and social history Use of alcohol, tobacco or illicit drugs  Current medications and supplements including opioid prescriptions. Functional ability and status Nutritional status Physical activity Advanced directives List of other physicians Hospitalizations, surgeries, and ER visits in previous 12 months Vitals Screenings to include cognitive, depression, and falls Referrals and appointments  Orders Placed This Encounter  Procedures   Flu vaccine HIGH DOSE PF(Fluzone Trivalent)   POCT URINALYSIS DIP (CLINITEK)   In addition, I have reviewed and discussed with patient certain preventive protocols, quality metrics, and best practice recommendations. A written personalized care plan for preventive services as well as general preventive health recommendations were provided to patient.   Araceli Zelda, CMA   09/20/2024   Return in one year or as needed.  After Visit Summary: (In Person-Printed) AVS printed and given to the patient

## 2024-09-22 ENCOUNTER — Other Ambulatory Visit: Payer: Self-pay | Admitting: Internal Medicine

## 2024-09-22 DIAGNOSIS — Z1231 Encounter for screening mammogram for malignant neoplasm of breast: Secondary | ICD-10-CM

## 2024-09-23 ENCOUNTER — Other Ambulatory Visit: Payer: Self-pay | Admitting: Internal Medicine

## 2024-10-01 ENCOUNTER — Encounter: Payer: Self-pay | Admitting: Internal Medicine

## 2024-12-12 ENCOUNTER — Ambulatory Visit

## 2024-12-28 ENCOUNTER — Institutional Professional Consult (permissible substitution) (HOSPITAL_BASED_OUTPATIENT_CLINIC_OR_DEPARTMENT_OTHER): Admitting: Internal Medicine
